# Patient Record
Sex: Female | Born: 1940 | Race: White | Hispanic: No | Marital: Married | Smoking: Never smoker
Health system: Southern US, Community
[De-identification: ages and names within clinical notes are randomized; demographics above are authoritative.]

## PROBLEM LIST (undated history)

## (undated) DIAGNOSIS — M2140 Flat foot [pes planus] (acquired), unspecified foot: Secondary | ICD-10-CM

## (undated) DIAGNOSIS — M84376A Stress fracture, unspecified foot, initial encounter for fracture: Secondary | ICD-10-CM

## (undated) DIAGNOSIS — T849XXA Unspecified complication of internal orthopedic prosthetic device, implant and graft, initial encounter: Secondary | ICD-10-CM

## (undated) DIAGNOSIS — M549 Dorsalgia, unspecified: Secondary | ICD-10-CM

## (undated) DIAGNOSIS — Z8659 Personal history of other mental and behavioral disorders: Secondary | ICD-10-CM

## (undated) DIAGNOSIS — M65319 Trigger thumb, unspecified thumb: Secondary | ICD-10-CM

## (undated) DIAGNOSIS — E785 Hyperlipidemia, unspecified: Secondary | ICD-10-CM

## (undated) DIAGNOSIS — I6523 Occlusion and stenosis of bilateral carotid arteries: Secondary | ICD-10-CM

## (undated) DIAGNOSIS — M19079 Primary osteoarthritis, unspecified ankle and foot: Secondary | ICD-10-CM

## (undated) DIAGNOSIS — M419 Scoliosis, unspecified: Secondary | ICD-10-CM

## (undated) DIAGNOSIS — M542 Cervicalgia: Secondary | ICD-10-CM

## (undated) DIAGNOSIS — G5600 Carpal tunnel syndrome, unspecified upper limb: Secondary | ICD-10-CM

## (undated) DIAGNOSIS — Z96651 Presence of right artificial knee joint: Secondary | ICD-10-CM

## (undated) DIAGNOSIS — S92309A Fracture of unspecified metatarsal bone(s), unspecified foot, initial encounter for closed fracture: Secondary | ICD-10-CM

## (undated) DIAGNOSIS — E079 Disorder of thyroid, unspecified: Secondary | ICD-10-CM

## (undated) DIAGNOSIS — E559 Vitamin D deficiency, unspecified: Secondary | ICD-10-CM

## (undated) DIAGNOSIS — J302 Other seasonal allergic rhinitis: Secondary | ICD-10-CM

## (undated) DIAGNOSIS — K219 Gastro-esophageal reflux disease without esophagitis: Secondary | ICD-10-CM

## (undated) DIAGNOSIS — E871 Hypo-osmolality and hyponatremia: Secondary | ICD-10-CM

## (undated) DIAGNOSIS — N289 Disorder of kidney and ureter, unspecified: Secondary | ICD-10-CM

## (undated) DIAGNOSIS — D509 Iron deficiency anemia, unspecified: Secondary | ICD-10-CM

## (undated) DIAGNOSIS — T8489XA Other specified complication of internal orthopedic prosthetic devices, implants and grafts, initial encounter: Secondary | ICD-10-CM

## (undated) DIAGNOSIS — E539 Vitamin B deficiency, unspecified: Secondary | ICD-10-CM

## (undated) DIAGNOSIS — Z8673 Personal history of transient ischemic attack (TIA), and cerebral infarction without residual deficits: Secondary | ICD-10-CM

## (undated) DIAGNOSIS — J309 Allergic rhinitis, unspecified: Secondary | ICD-10-CM

## (undated) DIAGNOSIS — I6990 Unspecified sequelae of unspecified cerebrovascular disease: Secondary | ICD-10-CM

## (undated) DIAGNOSIS — M8580 Other specified disorders of bone density and structure, unspecified site: Secondary | ICD-10-CM

## (undated) DIAGNOSIS — M6240 Contracture of muscle, unspecified site: Secondary | ICD-10-CM

## (undated) DIAGNOSIS — M1710 Unilateral primary osteoarthritis, unspecified knee: Secondary | ICD-10-CM

## (undated) DIAGNOSIS — I10 Essential (primary) hypertension: Secondary | ICD-10-CM

## (undated) DIAGNOSIS — I509 Heart failure, unspecified: Secondary | ICD-10-CM

## (undated) DIAGNOSIS — G5760 Lesion of plantar nerve, unspecified lower limb: Secondary | ICD-10-CM

## (undated) DIAGNOSIS — M7540 Impingement syndrome of unspecified shoulder: Secondary | ICD-10-CM

## (undated) DIAGNOSIS — M75122 Complete rotator cuff tear or rupture of left shoulder, not specified as traumatic: Secondary | ICD-10-CM

## (undated) DIAGNOSIS — M722 Plantar fascial fibromatosis: Secondary | ICD-10-CM

## (undated) DIAGNOSIS — E039 Hypothyroidism, unspecified: Secondary | ICD-10-CM

## (undated) DIAGNOSIS — S82839A Other fracture of upper and lower end of unspecified fibula, initial encounter for closed fracture: Secondary | ICD-10-CM

## (undated) DIAGNOSIS — S8000XA Contusion of unspecified knee, initial encounter: Secondary | ICD-10-CM

## (undated) DIAGNOSIS — M19049 Primary osteoarthritis, unspecified hand: Secondary | ICD-10-CM

## (undated) DIAGNOSIS — I6340 Cerebral infarction due to embolism of unspecified cerebral artery: Secondary | ICD-10-CM

## (undated) DIAGNOSIS — S39012A Strain of muscle, fascia and tendon of lower back, initial encounter: Secondary | ICD-10-CM

## (undated) DIAGNOSIS — E7800 Pure hypercholesterolemia, unspecified: Secondary | ICD-10-CM

## (undated) DIAGNOSIS — F411 Generalized anxiety disorder: Secondary | ICD-10-CM

## (undated) DIAGNOSIS — R51 Headache: Secondary | ICD-10-CM

## (undated) DIAGNOSIS — M4800 Spinal stenosis, site unspecified: Secondary | ICD-10-CM

## (undated) DIAGNOSIS — J310 Chronic rhinitis: Secondary | ICD-10-CM

## (undated) DIAGNOSIS — F419 Anxiety disorder, unspecified: Secondary | ICD-10-CM

## (undated) DIAGNOSIS — R600 Localized edema: Secondary | ICD-10-CM

## (undated) DIAGNOSIS — I6529 Occlusion and stenosis of unspecified carotid artery: Secondary | ICD-10-CM

## (undated) DIAGNOSIS — Z524 Kidney donor: Secondary | ICD-10-CM

## (undated) DIAGNOSIS — G5762 Lesion of plantar nerve, left lower limb: Secondary | ICD-10-CM

## (undated) DIAGNOSIS — Z85828 Personal history of other malignant neoplasm of skin: Secondary | ICD-10-CM

## (undated) DIAGNOSIS — G4700 Insomnia, unspecified: Secondary | ICD-10-CM

## (undated) DIAGNOSIS — M66879 Spontaneous rupture of other tendons, unspecified ankle and foot: Secondary | ICD-10-CM

## (undated) DIAGNOSIS — G43909 Migraine, unspecified, not intractable, without status migrainosus: Secondary | ICD-10-CM

## (undated) DIAGNOSIS — H02409 Unspecified ptosis of unspecified eyelid: Secondary | ICD-10-CM

## (undated) DIAGNOSIS — M76829 Posterior tibial tendinitis, unspecified leg: Secondary | ICD-10-CM

## (undated) HISTORY — DX: Primary osteoarthritis, unspecified hand: M19.049

## (undated) HISTORY — DX: Unspecified ptosis of unspecified eyelid: H02.409

## (undated) HISTORY — DX: Personal history of other mental and behavioral disorders: Z86.59

## (undated) HISTORY — DX: Personal history of other malignant neoplasm of skin: Z85.828

## (undated) HISTORY — DX: Personal history of transient ischemic attack (TIA), and cerebral infarction without residual deficits: Z86.73

## (undated) HISTORY — DX: Occlusion and stenosis of unspecified carotid artery: I65.29

## (undated) HISTORY — DX: Other fracture of upper and lower end of unspecified fibula, initial encounter for closed fracture: S82.839A

## (undated) HISTORY — DX: Other specified disorders of bone density and structure, unspecified site: M85.80

## (undated) HISTORY — DX: Hyperlipidemia, unspecified: E78.5

## (undated) HISTORY — DX: Fracture of unspecified metatarsal bone(s), unspecified foot, initial encounter for closed fracture: S92.309A

## (undated) HISTORY — DX: Stress fracture, unspecified foot, initial encounter for fracture: M84.376A

## (undated) HISTORY — DX: Iron deficiency anemia, unspecified: D50.9

## (undated) HISTORY — DX: Trigger thumb, unspecified thumb: M65.319

## (undated) HISTORY — DX: Occlusion and stenosis of bilateral carotid arteries: I65.23

## (undated) HISTORY — DX: Hypo-osmolality and hyponatremia: E87.1

## (undated) HISTORY — DX: Lesion of plantar nerve, left lower limb: G57.62

## (undated) HISTORY — DX: Other specified complication of internal orthopedic prosthetic devices, implants and grafts, initial encounter: T84.89XA

## (undated) HISTORY — DX: Complete rotator cuff tear or rupture of left shoulder, not specified as traumatic: M75.122

## (undated) HISTORY — DX: Vitamin D deficiency, unspecified: E55.9

## (undated) HISTORY — DX: Posterior tibial tendinitis, unspecified leg: M76.829

## (undated) HISTORY — DX: Generalized anxiety disorder: F41.1

## (undated) HISTORY — DX: Lesion of plantar nerve, unspecified lower limb: G57.60

## (undated) HISTORY — DX: Impingement syndrome of unspecified shoulder: M75.40

## (undated) HISTORY — DX: Carpal tunnel syndrome, unspecified upper limb: G56.00

## (undated) HISTORY — DX: Unspecified sequelae of unspecified cerebrovascular disease: I69.90

## (undated) HISTORY — DX: Presence of right artificial knee joint: Z96.651

## (undated) HISTORY — DX: Strain of muscle, fascia and tendon of lower back, initial encounter: S39.012A

## (undated) HISTORY — DX: Spinal stenosis, site unspecified: M48.00

## (undated) HISTORY — DX: Kidney donor: Z52.4

## (undated) HISTORY — DX: Headache: R51

## (undated) HISTORY — DX: Vitamin B deficiency, unspecified: E53.9

## (undated) HISTORY — DX: Hypothyroidism, unspecified: E03.9

## (undated) HISTORY — DX: Contusion of unspecified knee, initial encounter: S80.00XA

## (undated) HISTORY — DX: Insomnia, unspecified: G47.00

## (undated) HISTORY — DX: Gastro-esophageal reflux disease without esophagitis: K21.9

## (undated) HISTORY — DX: Allergic rhinitis, unspecified: J30.9

## (undated) HISTORY — DX: Unilateral primary osteoarthritis, unspecified knee: M17.10

## (undated) HISTORY — PX: OTHER SURGICAL HISTORY: SHX169

## (undated) HISTORY — DX: Chronic rhinitis: J31.0

## (undated) HISTORY — DX: Spontaneous rupture of other tendons, unspecified ankle and foot: M66.879

## (undated) HISTORY — DX: Primary osteoarthritis, unspecified ankle and foot: M19.079

## (undated) HISTORY — DX: Plantar fascial fibromatosis: M72.2

## (undated) HISTORY — DX: Scoliosis, unspecified: M41.9

## (undated) HISTORY — DX: Flat foot (pes planus) (acquired), unspecified foot: M21.40

## (undated) HISTORY — DX: Essential (primary) hypertension: I10

## (undated) HISTORY — DX: Unspecified complication of internal orthopedic prosthetic device, implant and graft, initial encounter: T84.9XXA

## (undated) HISTORY — DX: Cerebral infarction due to embolism of unspecified cerebral artery: I63.40

## (undated) HISTORY — DX: Contracture of muscle, unspecified site: M62.40

## (undated) HISTORY — DX: Localized edema: R60.0

---
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
## 1960-10-16 HISTORY — PX: DERMOID CYST  EXCISION: SHX1452

## 1962-12-15 HISTORY — PX: DILATION AND CURETTAGE OF UTERUS: SHX78

## 1971-03-16 HISTORY — PX: ABDOMINAL HYSTERECTOMY: SHX81

## 1979-07-17 HISTORY — PX: OOPHORECTOMY: SHX86

## 1984-09-15 HISTORY — PX: NASAL SINUS SURGERY: SHX719

## 1997-02-13 HISTORY — PX: KIDNEY DONATION: SHX685

## 2003-09-16 HISTORY — PX: KNEE SURGERY: SHX244

## 2004-11-17 ENCOUNTER — Emergency Department (HOSPITAL_COMMUNITY): Admission: EM | Admit: 2004-11-17 | Discharge: 2004-11-17 | Payer: Self-pay | Admitting: Family Medicine

## 2005-07-16 HISTORY — PX: OTHER SURGICAL HISTORY: SHX169

## 2006-09-15 HISTORY — PX: CAROTID ARTERY ANGIOPLASTY: SHX1300

## 2009-02-13 HISTORY — PX: CATARACT EXTRACTION, BILATERAL: SHX1313

## 2009-11-13 HISTORY — PX: REPLACEMENT TOTAL KNEE: SUR1224

## 2010-10-28 DIAGNOSIS — I6990 Unspecified sequelae of unspecified cerebrovascular disease: Secondary | ICD-10-CM

## 2010-10-28 HISTORY — DX: Unspecified sequelae of unspecified cerebrovascular disease: I69.90

## 2010-11-13 DIAGNOSIS — M4802 Spinal stenosis, cervical region: Secondary | ICD-10-CM | POA: Insufficient documentation

## 2011-01-02 DIAGNOSIS — G5600 Carpal tunnel syndrome, unspecified upper limb: Secondary | ICD-10-CM

## 2011-01-02 HISTORY — DX: Carpal tunnel syndrome, unspecified upper limb: G56.00

## 2011-01-14 HISTORY — PX: CARPAL TUNNEL RELEASE: SHX101

## 2011-01-15 DIAGNOSIS — D509 Iron deficiency anemia, unspecified: Secondary | ICD-10-CM

## 2011-01-15 DIAGNOSIS — E539 Vitamin B deficiency, unspecified: Secondary | ICD-10-CM

## 2011-01-15 HISTORY — DX: Iron deficiency anemia, unspecified: D50.9

## 2011-01-15 HISTORY — DX: Vitamin B deficiency, unspecified: E53.9

## 2011-04-30 DIAGNOSIS — E871 Hypo-osmolality and hyponatremia: Secondary | ICD-10-CM | POA: Insufficient documentation

## 2011-04-30 HISTORY — DX: Hypo-osmolality and hyponatremia: E87.1

## 2011-06-16 HISTORY — PX: PTOSIS REPAIR: SHX6568

## 2011-06-17 DIAGNOSIS — H02409 Unspecified ptosis of unspecified eyelid: Secondary | ICD-10-CM

## 2011-06-17 HISTORY — DX: Unspecified ptosis of unspecified eyelid: H02.409

## 2011-11-10 DIAGNOSIS — E559 Vitamin D deficiency, unspecified: Secondary | ICD-10-CM | POA: Insufficient documentation

## 2011-11-10 HISTORY — DX: Vitamin D deficiency, unspecified: E55.9

## 2011-12-30 DIAGNOSIS — M12279 Villonodular synovitis (pigmented), unspecified ankle and foot: Secondary | ICD-10-CM | POA: Insufficient documentation

## 2011-12-30 DIAGNOSIS — S92309A Fracture of unspecified metatarsal bone(s), unspecified foot, initial encounter for closed fracture: Secondary | ICD-10-CM

## 2011-12-30 HISTORY — DX: Fracture of unspecified metatarsal bone(s), unspecified foot, initial encounter for closed fracture: S92.309A

## 2012-01-01 DIAGNOSIS — S8000XA Contusion of unspecified knee, initial encounter: Secondary | ICD-10-CM | POA: Insufficient documentation

## 2012-01-01 HISTORY — DX: Contusion of unspecified knee, initial encounter: S80.00XA

## 2012-04-27 DIAGNOSIS — I6529 Occlusion and stenosis of unspecified carotid artery: Secondary | ICD-10-CM | POA: Insufficient documentation

## 2012-04-27 HISTORY — DX: Occlusion and stenosis of unspecified carotid artery: I65.29

## 2012-06-09 DIAGNOSIS — M2140 Flat foot [pes planus] (acquired), unspecified foot: Secondary | ICD-10-CM

## 2012-06-09 HISTORY — DX: Flat foot (pes planus) (acquired), unspecified foot: M21.40

## 2012-06-15 HISTORY — PX: PTOSIS REPAIR: SHX6568

## 2012-07-16 HISTORY — PX: LUMBAR FUSION: SHX111

## 2012-08-09 DIAGNOSIS — M419 Scoliosis, unspecified: Secondary | ICD-10-CM | POA: Insufficient documentation

## 2012-08-09 HISTORY — DX: Scoliosis, unspecified: M41.9

## 2012-10-11 DIAGNOSIS — R519 Headache, unspecified: Secondary | ICD-10-CM

## 2012-10-11 DIAGNOSIS — R51 Headache: Secondary | ICD-10-CM

## 2012-10-11 HISTORY — DX: Headache, unspecified: R51.9

## 2012-11-03 DIAGNOSIS — K219 Gastro-esophageal reflux disease without esophagitis: Secondary | ICD-10-CM

## 2012-11-03 DIAGNOSIS — F411 Generalized anxiety disorder: Secondary | ICD-10-CM

## 2012-11-03 HISTORY — DX: Generalized anxiety disorder: F41.1

## 2012-11-03 HISTORY — DX: Gastro-esophageal reflux disease without esophagitis: K21.9

## 2012-12-01 DIAGNOSIS — M1710 Unilateral primary osteoarthritis, unspecified knee: Secondary | ICD-10-CM

## 2012-12-01 HISTORY — DX: Unilateral primary osteoarthritis, unspecified knee: M17.10

## 2013-02-14 DIAGNOSIS — J310 Chronic rhinitis: Secondary | ICD-10-CM | POA: Insufficient documentation

## 2013-02-14 DIAGNOSIS — M84376A Stress fracture, unspecified foot, initial encounter for fracture: Secondary | ICD-10-CM

## 2013-02-14 HISTORY — DX: Chronic rhinitis: J31.0

## 2013-02-14 HISTORY — DX: Stress fracture, unspecified foot, initial encounter for fracture: M84.376A

## 2013-03-15 DIAGNOSIS — C4492 Squamous cell carcinoma of skin, unspecified: Secondary | ICD-10-CM

## 2013-03-15 HISTORY — DX: Squamous cell carcinoma of skin, unspecified: C44.92

## 2013-03-15 HISTORY — PX: SQUAMOUS CELL CARCINOMA EXCISION: SHX2433

## 2013-04-05 DIAGNOSIS — Z85828 Personal history of other malignant neoplasm of skin: Secondary | ICD-10-CM

## 2013-04-05 HISTORY — DX: Personal history of other malignant neoplasm of skin: Z85.828

## 2013-06-06 DIAGNOSIS — E039 Hypothyroidism, unspecified: Secondary | ICD-10-CM | POA: Insufficient documentation

## 2013-06-06 HISTORY — DX: Hypothyroidism, unspecified: E03.9

## 2013-06-08 DIAGNOSIS — M8580 Other specified disorders of bone density and structure, unspecified site: Secondary | ICD-10-CM

## 2013-06-08 DIAGNOSIS — I7090 Unspecified atherosclerosis: Secondary | ICD-10-CM | POA: Insufficient documentation

## 2013-06-08 HISTORY — DX: Other specified disorders of bone density and structure, unspecified site: M85.80

## 2013-10-28 DIAGNOSIS — S39012A Strain of muscle, fascia and tendon of lower back, initial encounter: Secondary | ICD-10-CM

## 2013-10-28 HISTORY — DX: Strain of muscle, fascia and tendon of lower back, initial encounter: S39.012A

## 2013-11-13 HISTORY — PX: FOOT FRACTURE SURGERY: SHX645

## 2013-11-14 DIAGNOSIS — M76829 Posterior tibial tendinitis, unspecified leg: Secondary | ICD-10-CM

## 2013-11-14 DIAGNOSIS — M19079 Primary osteoarthritis, unspecified ankle and foot: Secondary | ICD-10-CM | POA: Insufficient documentation

## 2013-11-14 HISTORY — DX: Primary osteoarthritis, unspecified ankle and foot: M19.079

## 2013-11-14 HISTORY — DX: Posterior tibial tendinitis, unspecified leg: M76.829

## 2013-11-17 DIAGNOSIS — R760 Raised antibody titer: Secondary | ICD-10-CM | POA: Insufficient documentation

## 2013-11-17 DIAGNOSIS — Z905 Acquired absence of kidney: Secondary | ICD-10-CM | POA: Insufficient documentation

## 2013-11-17 DIAGNOSIS — Z8673 Personal history of transient ischemic attack (TIA), and cerebral infarction without residual deficits: Secondary | ICD-10-CM

## 2013-11-17 DIAGNOSIS — D500 Iron deficiency anemia secondary to blood loss (chronic): Secondary | ICD-10-CM | POA: Insufficient documentation

## 2013-11-17 DIAGNOSIS — I6340 Cerebral infarction due to embolism of unspecified cerebral artery: Secondary | ICD-10-CM

## 2013-11-17 HISTORY — DX: Personal history of transient ischemic attack (TIA), and cerebral infarction without residual deficits: Z86.73

## 2013-11-17 HISTORY — DX: Cerebral infarction due to embolism of unspecified cerebral artery: I63.40

## 2013-11-24 DIAGNOSIS — T8489XA Other specified complication of internal orthopedic prosthetic devices, implants and grafts, initial encounter: Secondary | ICD-10-CM

## 2013-11-24 DIAGNOSIS — M66879 Spontaneous rupture of other tendons, unspecified ankle and foot: Secondary | ICD-10-CM | POA: Insufficient documentation

## 2013-11-24 DIAGNOSIS — T849XXA Unspecified complication of internal orthopedic prosthetic device, implant and graft, initial encounter: Secondary | ICD-10-CM

## 2013-11-24 DIAGNOSIS — M6240 Contracture of muscle, unspecified site: Secondary | ICD-10-CM

## 2013-11-24 HISTORY — DX: Unspecified complication of internal orthopedic prosthetic device, implant and graft, initial encounter: T84.9XXA

## 2013-11-24 HISTORY — DX: Contracture of muscle, unspecified site: M62.40

## 2013-11-24 HISTORY — DX: Other specified complication of internal orthopedic prosthetic devices, implants and grafts, initial encounter: T84.89XA

## 2013-11-24 HISTORY — DX: Spontaneous rupture of other tendons, unspecified ankle and foot: M66.879

## 2014-06-13 DIAGNOSIS — M7540 Impingement syndrome of unspecified shoulder: Secondary | ICD-10-CM

## 2014-06-13 HISTORY — DX: Impingement syndrome of unspecified shoulder: M75.40

## 2014-10-18 DIAGNOSIS — M179 Osteoarthritis of knee, unspecified: Secondary | ICD-10-CM

## 2014-10-18 DIAGNOSIS — M1710 Unilateral primary osteoarthritis, unspecified knee: Secondary | ICD-10-CM

## 2014-10-18 HISTORY — DX: Osteoarthritis of knee, unspecified: M17.9

## 2014-10-18 HISTORY — DX: Unilateral primary osteoarthritis, unspecified knee: M17.10

## 2014-12-07 DIAGNOSIS — M65319 Trigger thumb, unspecified thumb: Secondary | ICD-10-CM | POA: Insufficient documentation

## 2014-12-07 HISTORY — DX: Trigger thumb, unspecified thumb: M65.319

## 2015-01-18 DIAGNOSIS — M19049 Primary osteoarthritis, unspecified hand: Secondary | ICD-10-CM | POA: Insufficient documentation

## 2015-01-18 HISTORY — DX: Primary osteoarthritis, unspecified hand: M19.049

## 2015-02-08 DIAGNOSIS — M75122 Complete rotator cuff tear or rupture of left shoulder, not specified as traumatic: Secondary | ICD-10-CM

## 2015-02-08 HISTORY — DX: Complete rotator cuff tear or rupture of left shoulder, not specified as traumatic: M75.122

## 2015-03-16 HISTORY — PX: ROTATOR CUFF REPAIR: SHX139

## 2015-05-07 DIAGNOSIS — G5762 Lesion of plantar nerve, left lower limb: Secondary | ICD-10-CM | POA: Insufficient documentation

## 2015-05-07 DIAGNOSIS — G5760 Lesion of plantar nerve, unspecified lower limb: Secondary | ICD-10-CM

## 2015-05-07 HISTORY — DX: Lesion of plantar nerve, left lower limb: G57.62

## 2015-05-07 HISTORY — DX: Lesion of plantar nerve, unspecified lower limb: G57.60

## 2015-07-11 DIAGNOSIS — Z96651 Presence of right artificial knee joint: Secondary | ICD-10-CM | POA: Insufficient documentation

## 2015-07-11 HISTORY — DX: Presence of right artificial knee joint: Z96.651

## 2015-10-25 DIAGNOSIS — M87021 Idiopathic aseptic necrosis of right humerus: Secondary | ICD-10-CM | POA: Insufficient documentation

## 2015-11-05 DIAGNOSIS — Z8673 Personal history of transient ischemic attack (TIA), and cerebral infarction without residual deficits: Secondary | ICD-10-CM

## 2015-11-05 DIAGNOSIS — I10 Essential (primary) hypertension: Secondary | ICD-10-CM | POA: Insufficient documentation

## 2015-11-05 HISTORY — DX: Essential (primary) hypertension: I10

## 2015-11-05 HISTORY — DX: Personal history of transient ischemic attack (TIA), and cerebral infarction without residual deficits: Z86.73

## 2015-11-14 HISTORY — PX: TOTAL SHOULDER REPLACEMENT: SUR1217

## 2015-11-20 DIAGNOSIS — I6523 Occlusion and stenosis of bilateral carotid arteries: Secondary | ICD-10-CM | POA: Insufficient documentation

## 2015-11-20 HISTORY — DX: Occlusion and stenosis of bilateral carotid arteries: I65.23

## 2016-04-16 ENCOUNTER — Emergency Department: Payer: Medicare Other

## 2016-04-16 ENCOUNTER — Emergency Department
Admission: EM | Admit: 2016-04-16 | Discharge: 2016-04-16 | Disposition: A | Discharge status: Home / Self Care | Payer: Medicare Other | Attending: Emergency Medicine | Admitting: Emergency Medicine

## 2016-04-16 DIAGNOSIS — Y939 Activity, unspecified: Secondary | ICD-10-CM | POA: Insufficient documentation

## 2016-04-16 DIAGNOSIS — X58XXXA Exposure to other specified factors, initial encounter: Secondary | ICD-10-CM | POA: Diagnosis not present

## 2016-04-16 DIAGNOSIS — Y999 Unspecified external cause status: Secondary | ICD-10-CM | POA: Diagnosis not present

## 2016-04-16 DIAGNOSIS — S40011A Contusion of right shoulder, initial encounter: Secondary | ICD-10-CM | POA: Diagnosis not present

## 2016-04-16 DIAGNOSIS — Y929 Unspecified place or not applicable: Secondary | ICD-10-CM | POA: Diagnosis not present

## 2016-04-16 DIAGNOSIS — M25511 Pain in right shoulder: Secondary | ICD-10-CM | POA: Diagnosis present

## 2016-04-16 NOTE — ED Triage Notes (Addendum)
Patient ambulatory to triage with steady gait, without difficulty or distress noted; pt reports right shoulder replacement in March; PTA was getting ice out of freezer, slipped, injuring right shoulder; 1 vicodin taken PTA

## 2016-04-16 NOTE — Discharge Instructions (Signed)
Follow-up with your orthopedic doctor if continued pain in 4-48 hours. Wear sling as needed for comfort

## 2016-04-16 NOTE — ED Provider Notes (Signed)
[facility] Emergency Department Provider Note  ____________________________________________  Time seen: Approximately 9:32 PM  I have reviewed the triage vital signs and the nursing notes.   HISTORY  Chief Complaint Shoulder Injury    HPI Audrey Fletcher is a 75 y.o. female patient reports having a right shoulder replacement in March of this year and reinjuring and shoulder tonight. Complains of pain in the shoulder as a 10 over 10.   No past medical history on file.  There are no active problems to display for this patient.   No past surgical history on file.  Prior to Admission medications   Not on File    Allergies Review of patient's allergies indicates not on file.  No family history on file.  Social History Social History  Substance Use Topics  . Smoking status: Not on file  . Smokeless tobacco: Not on file  . Alcohol use Not on file    Review of Systems Constitutional: No fever/chills Musculoskeletal: Right shoulder pain. Skin: Negative for rash. Neurological: Negative for headaches, focal weakness or numbness.  10-point ROS otherwise negative.  ____________________________________________   PHYSICAL EXAM:  VITAL SIGNS: ED Triage Vitals  Enc Vitals Group     BP 04/16/16 2057 (!) 146/72     Pulse Rate 04/16/16 2057 71     Resp 04/16/16 2057 18     Temp 04/16/16 2057 98.3 F (36.8 C)     Temp Source 04/16/16 2057 Oral     SpO2 04/16/16 2057 95 %     Weight 04/16/16 2057 132 lb (59.9 kg)     Height 04/16/16 2057 5\' 3"  (1.6 m)     Head Circumference --      Peak Flow --      Pain Score 04/16/16 2056 10     Pain Loc --      Pain Edu? --      Excl. in [place]? --     Constitutional: Alert and oriented. Well appearing and in no acute distress. Neck: Supple, full range of motion nontender Musculoskeletal: Right shoulder with limited range of motion. Point tenderness noted to the humeral head region. No ecchymosis  bruising distally neurovascularly intact. Unable to extend abduct or abduct the shoulder. Neurologic:  Normal speech and language. No gross focal neurologic deficits are appreciated. No gait instability. Skin:  Skin is warm, dry and intact. No rash noted. Psychiatric: Mood and affect are normal. Speech and behavior are normal.  ____________________________________________   LABS (all labs ordered are listed, but only abnormal results are displayed)  Labs Reviewed - No data to display ____________________________________________  EKG   ____________________________________________  RADIOLOGY   ____________________________________________   PROCEDURES  Procedure(s) performed: None  Critical Care performed: No  ____________________________________________   INITIAL IMPRESSION / ASSESSMENT AND PLAN / ED COURSE  Pertinent labs & imaging results that were available during my care of the patient were reviewed by me and considered in my medical decision making (see chart for details).  Right shoulder contusion. Reassurance provided to the patient encouraged to follow up with her PCP orthopedic surgeon as needed. No acute fracture or dislocation of hardware noted.  Clinical Course    ____________________________________________   FINAL CLINICAL IMPRESSION(S) / ED DIAGNOSES  Final diagnoses:  Shoulder contusion, right, initial encounter     This chart was dictated using voice recognition software/Dragon. Despite best efforts to proofread, errors can occur which can change the meaning. Any change was purely unintentional.    Pierce Crane  Beers, PA-C 04/16/16 2147    Nance Pear, MD 04/16/16 2155

## 2016-05-05 DIAGNOSIS — M722 Plantar fascial fibromatosis: Secondary | ICD-10-CM

## 2016-05-05 HISTORY — DX: Plantar fascial fibromatosis: M72.2

## 2016-09-10 ENCOUNTER — Encounter: Payer: Self-pay | Admitting: Emergency Medicine

## 2016-09-10 ENCOUNTER — Emergency Department
Admission: EM | Admit: 2016-09-10 | Discharge: 2016-09-10 | Disposition: A | Discharge status: Home / Self Care | Payer: Medicare Other | Attending: Emergency Medicine | Admitting: Emergency Medicine

## 2016-09-10 ENCOUNTER — Emergency Department: Payer: Medicare Other

## 2016-09-10 DIAGNOSIS — W1809XA Striking against other object with subsequent fall, initial encounter: Secondary | ICD-10-CM | POA: Insufficient documentation

## 2016-09-10 DIAGNOSIS — Y999 Unspecified external cause status: Secondary | ICD-10-CM | POA: Insufficient documentation

## 2016-09-10 DIAGNOSIS — Y92002 Bathroom of unspecified non-institutional (private) residence single-family (private) house as the place of occurrence of the external cause: Secondary | ICD-10-CM | POA: Diagnosis not present

## 2016-09-10 DIAGNOSIS — I509 Heart failure, unspecified: Secondary | ICD-10-CM | POA: Insufficient documentation

## 2016-09-10 DIAGNOSIS — S8264XA Nondisplaced fracture of lateral malleolus of right fibula, initial encounter for closed fracture: Secondary | ICD-10-CM | POA: Diagnosis not present

## 2016-09-10 DIAGNOSIS — S8991XA Unspecified injury of right lower leg, initial encounter: Secondary | ICD-10-CM | POA: Diagnosis present

## 2016-09-10 DIAGNOSIS — Y9389 Activity, other specified: Secondary | ICD-10-CM | POA: Insufficient documentation

## 2016-09-10 DIAGNOSIS — W19XXXA Unspecified fall, initial encounter: Secondary | ICD-10-CM

## 2016-09-10 HISTORY — DX: Dorsalgia, unspecified: M54.9

## 2016-09-10 HISTORY — DX: Disorder of thyroid, unspecified: E07.9

## 2016-09-10 HISTORY — DX: Other seasonal allergic rhinitis: J30.2

## 2016-09-10 HISTORY — DX: Heart failure, unspecified: I50.9

## 2016-09-10 HISTORY — DX: Disorder of kidney and ureter, unspecified: N28.9

## 2016-09-10 HISTORY — DX: Anxiety disorder, unspecified: F41.9

## 2016-09-10 HISTORY — DX: Migraine, unspecified, not intractable, without status migrainosus: G43.909

## 2016-09-10 HISTORY — DX: Pure hypercholesterolemia, unspecified: E78.00

## 2016-09-10 HISTORY — DX: Cervicalgia: M54.2

## 2016-09-10 LAB — URINALYSIS, COMPLETE (UACMP) WITH MICROSCOPIC
Bilirubin Urine: NEGATIVE
Glucose, UA: NEGATIVE mg/dL
Ketones, ur: NEGATIVE mg/dL
Nitrite: NEGATIVE
Protein, ur: NEGATIVE mg/dL
Specific Gravity, Urine: 1.004 — ABNORMAL LOW (ref 1.005–1.030)
pH: 7 (ref 5.0–8.0)

## 2016-09-10 MED ORDER — HYDROCODONE-ACETAMINOPHEN 5-300 MG PO TABS: 1.0000 | ORAL_TABLET | Freq: Four times a day (QID) | ORAL | 0 refills | Status: AC | PRN

## 2016-09-10 MED ORDER — HYDROCODONE-ACETAMINOPHEN 5-325 MG PO TABS: 1.0000 | ORAL_TABLET | Freq: Once | ORAL | Status: DC

## 2016-09-10 MED ORDER — HYDROCODONE-ACETAMINOPHEN 5-325 MG PO TABS
1.0000 | ORAL_TABLET | Freq: Once | ORAL | Status: AC
  Administered 2016-09-10: 1 via ORAL
  Filled 2016-09-10: qty 1

## 2016-09-10 NOTE — ED Triage Notes (Signed)
Pt to ed with c/o 2 falls today.  Pt states first fall caused her to hit her head on the door knob.  Reports she was standing at sink and left knee became weak and she fell.  Reports second fall caused her to hit her back on the nightstand and now has pain in head, back, right knee, right ankle and right leg pain. Denies loss of consciousness.

## 2016-09-10 NOTE — ED Provider Notes (Signed)
[facility] Emergency Department Provider Note ____________________________________________  Time seen: Approximately 3:45 PM  I have reviewed the triage vital signs and the nursing notes.   HISTORY  Chief Complaint Fall    HPI Audrey Fletcher is a 75 y.o. female presenting to the emergency department after 2 falls. The first fall occurred last night while patient was standing at the bathroom sink. Patient states that her right leg "gave out". Patient has a history of osteoarthritis of the right knee. Patient states that during initial fall, her head struck the bathroom door knob. From initial fall, patient complains of right ankle pain, right upper thigh pain and occipital soreness. Ankle pain is currently 8 out of 10 in intensity and described as aching. Occipital pain is 3 out of 10 in intensity. She denies loss of consciousness, nausea, vomiting and blurry vision. Patient fell again today while trying to use her walker. Patient did not hit her head during this second fall. She is complaining of right sided lumbar pain. Denies dysuria, hematuria or increased urinary frequency. 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 She resides in a townhouse without steps with her husband.  Past Medical History:  Diagnosis Date  . Anxiety   . Back pain   . CHF (congestive heart failure) ([place])   . High cholesterol   . Migraines   . Neck pain   . Renal disorder   . Seasonal allergies   . Thyroid disease     There are no active problems to display for this patient.   Past Surgical History:  Procedure Laterality Date  . thyroidectomy      Prior to Admission medications   Not on File    Allergies Allegra [fexofenadine]; Codeine; Oxycodone; Tetracyclines & related; and Sulfa antibiotics  History reviewed. No pertinent family history.  Social History Social History  Substance Use Topics  . Smoking status: Never Smoker  . Smokeless tobacco: Never  Used  . Alcohol use No    Review of Systems Constitutional: No recent illness. Cardiovascular: Denies chest pain or palpitations. Respiratory: Denies shortness of breath. Musculoskeletal: Pain along lateral malleolus, left flank and right upper thigh. Skin: Negative for rash, wound, lesion. Neurological: Negative for focal weakness or numbness.  ____________________________________________   PHYSICAL EXAM:  VITAL SIGNS: ED Triage Vitals  Enc Vitals Group     BP 09/10/16 1505 (!) 131/54     Pulse Rate 09/10/16 1505 86     Resp 09/10/16 1505 18     Temp 09/10/16 1505 98.1 F (36.7 C)     Temp Source 09/10/16 1505 Oral     SpO2 09/10/16 1505 97 %     Weight 09/10/16 1506 132 lb (59.9 kg)     Height 09/10/16 1506 5\' 3"  (1.6 m)     Head Circumference --      Peak Flow --      Pain Score 09/10/16 1506 2     Pain Loc --      Pain Edu? --      Excl. in [place]? --     Constitutional: Alert and oriented. Well appearing and in no acute distress. Eyes: Conjunctivae are normal. EOMI. Head: Atraumatic. Neck: No stridor.  Respiratory: Normal respiratory effort.   Musculoskeletal: Patient has 5/5 strength in the upper and lower extremities bilaterally. Full range of motion at the shoulder, elbow and wrist bilaterally. Full range of motion at the hip, knee and ankle bilaterally. She has tenderness elicited with  right hip flexor testing. She has tenderness to light palpation along lateral malleolus, right. Patient is able to demonstrate plantar flexion and dorsi flexion, right. Patient has no tenderness along lumbar spine. Pain is not intensified with flexion or extension at the spine. Palpable dorsalis pedis pulse, right. Reflexes are 2+ and symmetric. Neurologic:  Normal speech and language. No gross focal neurologic deficits are appreciated. Speech is normal. No gait instability. Skin:  Skin is warm, dry and intact. Atraumatic. Psychiatric: Mood and affect are normal. Speech and behavior  are normal.  ____________________________________________   LABS (all labs ordered are listed, but only abnormal results are displayed)  Labs Reviewed  URINALYSIS, COMPLETE (UACMP) WITH MICROSCOPIC   ____________________________________________  RADIOLOGY  Unk Pinto, personally viewed and evaluated these images (plain radiographs) as part of my medical decision making, as well as reviewing the written report by the radiologist.  Lumbar:  IMPRESSION:  1. Posterior fusion from L2-L5 with normal alignment.  2. No acute compression deformity.  3. Question of mild subsidence of the interbody fusion plug into the  superior aspect of L5.   DG Hip Unilat with Pelvis  IMPRESSION:  1. No acute fracture.  2. Probable old fracture of the left pelvic ramus.  3. Osteopenia.   DG Ankle Complete Right  Subtle nondisplaced oblique fracture of the distal right fibula.  PROCEDURES  Procedure(s) performed:  Vicodin  Right lower extremity splint    ____________________________________________   INITIAL IMPRESSION / ASSESSMENT AND PLAN / ED COURSE  Clinical Course     Pertinent labs & imaging results that were available during my care of the patient were reviewed by me and considered in my medical decision making (see chart for details).  Assessment and plan: Patient presented to the emergency department after 2 falls. She does not have a history of falls prior to last night. No loss of consciousness, blurry vision, nausea or vomiting. CT head is not warranted at this time. DG right ankle complete reveals subtle lateral malleolar fracture. Patient underwent right lower extremity splinting the emergency department tonight. She was advised to follow up with orthopedics tomorrow. A referral was made to the orthopedist on-call, Dr. Mack Guise. Patient was discharged with a 2 day course of Vicodin. Vicodin was selected as patient has tolerated it well in the past. All patient questions  were answered. Vital signs are stable at this time.  ____________________________________________   FINAL CLINICAL IMPRESSION(S) / ED DIAGNOSES  Final diagnoses:  Falls       Lannie Fields, PA-C 09/10/16 1817    Hinda Kehr, MD 09/11/16 0000

## 2016-09-10 NOTE — ED Notes (Signed)
Pt taken to xray 

## 2016-09-10 NOTE — ED Notes (Signed)
Pt states fell last night, hit back of head on door knob. States fell again this morning in bathroom, states hit middle back and R ankle/foot/leg. Pt is alert and oriented x 4, speaking in complete sentences, sitting in wheelchair.  Pt aware of need for urine, stating she urinated while waiting in lobby.

## 2016-09-11 DIAGNOSIS — S82839A Other fracture of upper and lower end of unspecified fibula, initial encounter for closed fracture: Secondary | ICD-10-CM | POA: Insufficient documentation

## 2016-09-11 HISTORY — DX: Other fracture of upper and lower end of unspecified fibula, initial encounter for closed fracture: S82.839A

## 2017-04-02 DIAGNOSIS — Z524 Kidney donor: Secondary | ICD-10-CM

## 2017-04-02 HISTORY — DX: Kidney donor: Z52.4

## 2017-04-03 DIAGNOSIS — E785 Hyperlipidemia, unspecified: Secondary | ICD-10-CM | POA: Insufficient documentation

## 2017-04-03 DIAGNOSIS — K219 Gastro-esophageal reflux disease without esophagitis: Secondary | ICD-10-CM | POA: Insufficient documentation

## 2017-04-03 DIAGNOSIS — M4800 Spinal stenosis, site unspecified: Secondary | ICD-10-CM

## 2017-04-03 DIAGNOSIS — G4700 Insomnia, unspecified: Secondary | ICD-10-CM

## 2017-04-03 DIAGNOSIS — Z8659 Personal history of other mental and behavioral disorders: Secondary | ICD-10-CM | POA: Insufficient documentation

## 2017-04-03 HISTORY — DX: Personal history of other mental and behavioral disorders: Z86.59

## 2017-04-03 HISTORY — DX: Gastro-esophageal reflux disease without esophagitis: K21.9

## 2017-04-03 HISTORY — DX: Insomnia, unspecified: G47.00

## 2017-04-03 HISTORY — DX: Spinal stenosis, site unspecified: M48.00

## 2017-04-03 HISTORY — DX: Hyperlipidemia, unspecified: E78.5

## 2017-04-20 DIAGNOSIS — R600 Localized edema: Secondary | ICD-10-CM | POA: Insufficient documentation

## 2017-04-20 DIAGNOSIS — N183 Chronic kidney disease, stage 3 unspecified: Secondary | ICD-10-CM | POA: Insufficient documentation

## 2017-04-20 HISTORY — DX: Localized edema: R60.0

## 2017-04-20 HISTORY — PX: TOTAL KNEE ARTHROPLASTY: SHX125

## 2017-06-04 ENCOUNTER — Encounter: Payer: Self-pay | Admitting: *Deleted

## 2017-06-04 ENCOUNTER — Emergency Department: Payer: No Typology Code available for payment source

## 2017-06-04 ENCOUNTER — Emergency Department
Admission: EM | Admit: 2017-06-04 | Discharge: 2017-06-04 | Disposition: A | Discharge status: Home / Self Care | Payer: No Typology Code available for payment source | Attending: Student in an Organized Health Care Education/Training Program | Admitting: Student in an Organized Health Care Education/Training Program

## 2017-06-04 DIAGNOSIS — M25569 Pain in unspecified knee: Secondary | ICD-10-CM

## 2017-06-04 DIAGNOSIS — M25562 Pain in left knee: Secondary | ICD-10-CM | POA: Insufficient documentation

## 2017-06-04 DIAGNOSIS — I509 Heart failure, unspecified: Secondary | ICD-10-CM | POA: Insufficient documentation

## 2017-06-04 DIAGNOSIS — R0789 Other chest pain: Secondary | ICD-10-CM | POA: Insufficient documentation

## 2017-06-04 MED ORDER — TRAMADOL HCL 50 MG PO TABS
50.0000 mg | ORAL_TABLET | Freq: Once | ORAL | Status: AC
  Administered 2017-06-04: 50 mg via ORAL
  Filled 2017-06-04: qty 1

## 2017-06-04 MED ORDER — ACETAMINOPHEN 325 MG PO TABS
650.0000 mg | ORAL_TABLET | Freq: Once | ORAL | Status: AC
  Administered 2017-06-04: 650 mg via ORAL
  Filled 2017-06-04: qty 2

## 2017-06-04 MED ORDER — TRAMADOL HCL 50 MG PO TABS: 50.0000 mg | ORAL_TABLET | Freq: Four times a day (QID) | ORAL | 0 refills | Status: DC | PRN
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
## 2017-06-04 NOTE — ED Notes (Signed)
MD at bedside with RN, patient and patient's husband
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
## 2017-06-04 NOTE — ED Notes (Signed)
Patient transported to X-ray 

## 2017-06-04 NOTE — ED Notes (Signed)
MD at bedside. 
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
## 2017-06-24 ENCOUNTER — Other Ambulatory Visit: Payer: Self-pay

## 2017-06-24 ENCOUNTER — Ambulatory Visit (INDEPENDENT_AMBULATORY_CARE_PROVIDER_SITE_OTHER): Payer: Medicare Other | Admitting: Cardiothoracic Surgery

## 2017-06-24 VITALS — BP 111/63 | HR 58 | Temp 98.1°F | Wt 136.0 lb

## 2017-06-24 DIAGNOSIS — S2220XA Unspecified fracture of sternum, initial encounter for closed fracture: Secondary | ICD-10-CM | POA: Diagnosis not present

## 2017-06-24 DIAGNOSIS — J309 Allergic rhinitis, unspecified: Secondary | ICD-10-CM | POA: Insufficient documentation

## 2017-06-24 DIAGNOSIS — I10 Essential (primary) hypertension: Secondary | ICD-10-CM

## 2017-06-24 DIAGNOSIS — S2220XB Unspecified fracture of sternum, initial encounter for open fracture: Secondary | ICD-10-CM | POA: Diagnosis not present

## 2017-06-24 HISTORY — DX: Essential (primary) hypertension: I10

## 2017-06-24 HISTORY — DX: Allergic rhinitis, unspecified: J30.9
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
## 2017-06-24 NOTE — Patient Instructions (Addendum)
We will see you soon to give you the CT Scan results.

## 2017-07-01 ENCOUNTER — Ambulatory Visit
Admission: RE | Admit: 2017-07-01 | Discharge: 2017-07-01 | Disposition: A | Discharge status: Home / Self Care | Payer: Medicare Other | Source: Ambulatory Visit | Attending: Cardiothoracic Surgery | Admitting: Cardiothoracic Surgery

## 2017-07-01 DIAGNOSIS — S2222XA Fracture of body of sternum, initial encounter for closed fracture: Secondary | ICD-10-CM | POA: Insufficient documentation

## 2017-07-01 DIAGNOSIS — X58XXXA Exposure to other specified factors, initial encounter: Secondary | ICD-10-CM | POA: Insufficient documentation

## 2017-07-01 DIAGNOSIS — S2220XB Unspecified fracture of sternum, initial encounter for open fracture: Secondary | ICD-10-CM

## 2017-07-01 DIAGNOSIS — I700 Atherosclerosis of aorta: Secondary | ICD-10-CM | POA: Insufficient documentation

## 2017-07-01 DIAGNOSIS — K449 Diaphragmatic hernia without obstruction or gangrene: Secondary | ICD-10-CM | POA: Insufficient documentation

## 2017-07-01 LAB — POCT I-STAT CREATININE: Creatinine, Ser: 1.1 mg/dL — ABNORMAL HIGH (ref 0.44–1.00)

## 2017-07-01 MED ORDER — IOPAMIDOL (ISOVUE-300) INJECTION 61%
75.0000 mL | Freq: Once | INTRAVENOUS | Status: AC | PRN
  Administered 2017-07-01: 75 mL via INTRAVENOUS

## 2017-07-03 ENCOUNTER — Ambulatory Visit (INDEPENDENT_AMBULATORY_CARE_PROVIDER_SITE_OTHER): Payer: Medicare Other | Admitting: Cardiothoracic Surgery

## 2017-07-03 ENCOUNTER — Encounter: Payer: Self-pay | Admitting: Cardiothoracic Surgery

## 2017-07-03 ENCOUNTER — Ambulatory Visit: Payer: Self-pay | Admitting: Cardiothoracic Surgery

## 2017-07-03 VITALS — BP 120/67 | HR 64 | Temp 97.7°F | Ht 63.0 in | Wt 141.6 lb

## 2017-07-03 DIAGNOSIS — S2220XD Unspecified fracture of sternum, subsequent encounter for fracture with routine healing: Secondary | ICD-10-CM | POA: Diagnosis not present
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
## 2017-07-03 NOTE — Progress Notes (Signed)
  Patient ID: Audrey Fletcher, female   DOB: 23-Jul-1941, 76 y.o.   MRN: [id number]  HISTORY: He returns today in follow-up. She continues to complain of chest pain.  She describes significant pain with coughing and sneezing.  He also feels a burning sensation or a tearing sensation with movements. She did have a chest CT scan done. She thinks that the pain is actually improving over the course of time She is unable to take nonsteroidals secondary to her poor renal function.  Vitals:   07/03/17 1009  BP: 120/67  Pulse: 64  Temp: 97.7 F (36.5 C)     EXAM:    Resp: Lungs are clear bilaterally.  No respiratory distress, normal effort. Heart:  Regular without murmurs Abd:  Abdomen is soft, non distended and non tender. No masses are palpable.  There is no rebound and no guarding.  Neurological: Alert and oriented to person, place, and time. Coordination normal.  Skin: Skin is warm and dry. No rash noted. No diaphoretic. No erythema. No pallor.  Psychiatric: Normal mood and affect. Normal behavior. Judgment and thought content normal.  Here is some tenderness to palpation along the anterior chest wall. There is no bruising evident.   ASSESSMENT: I have independently reviewed her chest CT. There is a fracture of the midsternum involving he proximal portion of the body.There is no surrounding hematoma or other defect  PLAN:   I explained to the patient that her sternal fracture is something that only time and medications would help.I did not recommend any surgical intervention.I would like to see her back again in one month    Nestor Lewandowsky, MD

## 2017-07-06 ENCOUNTER — Encounter: Payer: Self-pay | Admitting: Cardiothoracic Surgery

## 2017-10-09 ENCOUNTER — Ambulatory Visit
Admission: RE | Admit: 2017-10-09 | Discharge: 2017-10-09 | Disposition: A | Discharge status: Home / Self Care | Payer: Medicare Other | Source: Ambulatory Visit | Attending: Cardiothoracic Surgery | Admitting: Cardiothoracic Surgery

## 2017-10-09 ENCOUNTER — Encounter: Payer: Self-pay | Admitting: Cardiothoracic Surgery

## 2017-10-09 ENCOUNTER — Ambulatory Visit (INDEPENDENT_AMBULATORY_CARE_PROVIDER_SITE_OTHER): Payer: Medicare Other | Admitting: Cardiothoracic Surgery

## 2017-10-09 ENCOUNTER — Other Ambulatory Visit: Payer: Self-pay

## 2017-10-09 VITALS — BP 138/77 | HR 65 | Temp 98.0°F | Resp 18 | Ht 63.0 in | Wt 142.2 lb

## 2017-10-09 DIAGNOSIS — S2220XB Unspecified fracture of sternum, initial encounter for open fracture: Secondary | ICD-10-CM | POA: Insufficient documentation

## 2017-10-09 DIAGNOSIS — S2220XD Unspecified fracture of sternum, subsequent encounter for fracture with routine healing: Secondary | ICD-10-CM | POA: Diagnosis not present

## 2017-10-09 DIAGNOSIS — X58XXXA Exposure to other specified factors, initial encounter: Secondary | ICD-10-CM | POA: Insufficient documentation

## 2017-10-09 NOTE — Patient Instructions (Signed)
Please call our office if you have questions or concerns.   

## 2017-10-09 NOTE — Progress Notes (Signed)
  Patient ID: Audrey Fletcher, female   DOB: 12-Mar-1941, 77 y.o.   MRN: [id number]  HISTORY: She returns today in follow-up.  She is now about 4 or 5 months out from her sternal fracture.  She states that the pain is much improved.  It only hurts whenever she coughs.  She has no other complaints today.  She is not short of breath.   Vitals:   10/09/17 0854  BP: 138/77  Pulse: 65  Resp: 18  Temp: 98 F (36.7 C)  SpO2: 95%     EXAM:    Resp: Lungs are clear bilaterally.  No respiratory distress, normal effort. Heart:  Regular without murmurs Abd:  Abdomen is soft, non distended and non tender. No masses are palpable.  There is no rebound and no guarding.  Neurological: Alert and oriented to person, place, and time. Coordination normal.  Skin: Skin is warm and dry. No rash noted. No diaphoretic. No erythema. No pallor.  Psychiatric: Normal mood and affect. Normal behavior. Judgment and thought content normal.  There may be a slight palpable deformity to the sternum just below the angle of [place].  There is no soft tissue component.  There is no erythema.   ASSESSMENT: I have independently reviewed the chest x-ray she had made today.  This shows a continued sternal fracture with some callus formation.   PLAN:   At the present time I did not see any need for any additional follow-up as she is essentially asymptomatic.  If she has any further problems I be happy to see her in follow-up.    Nestor Lewandowsky, MD

## 2017-11-05 ENCOUNTER — Telehealth: Payer: Self-pay | Admitting: Cardiothoracic Surgery

## 2017-11-05 NOTE — Telephone Encounter (Signed)
Patient stated that she is having pain on the right side of chest radiating up to her  shoulder blade. She states that the pain medication is helping but that she does not want to take them every time she is having pain.  I advised patient that we suggest that she follow up with her primary care doctor and if  They advise we can refer her to pain management. Patient verbalized understanding at this time.

## 2017-11-05 NOTE — Telephone Encounter (Signed)
Patients calling and would like on of the nurses to give her a call back, patient just has some questions or is wondering if she may need to be referred somewhere. Please call patient and advise.

## 2017-11-24 ENCOUNTER — Emergency Department: Payer: No Typology Code available for payment source

## 2017-11-24 ENCOUNTER — Inpatient Hospital Stay
Admission: EM | Admit: 2017-11-24 | Discharge: 2017-11-26 | DRG: 551 | Disposition: A | Discharge status: Home Health | Payer: No Typology Code available for payment source | Attending: Specialist | Admitting: Specialist

## 2017-11-24 DIAGNOSIS — G43909 Migraine, unspecified, not intractable, without status migrainosus: Secondary | ICD-10-CM | POA: Diagnosis present

## 2017-11-24 DIAGNOSIS — E7800 Pure hypercholesterolemia, unspecified: Secondary | ICD-10-CM | POA: Diagnosis present

## 2017-11-24 DIAGNOSIS — Z8249 Family history of ischemic heart disease and other diseases of the circulatory system: Secondary | ICD-10-CM

## 2017-11-24 DIAGNOSIS — Z8673 Personal history of transient ischemic attack (TIA), and cerebral infarction without residual deficits: Secondary | ICD-10-CM

## 2017-11-24 DIAGNOSIS — Z882 Allergy status to sulfonamides status: Secondary | ICD-10-CM

## 2017-11-24 DIAGNOSIS — S2221XA Fracture of manubrium, initial encounter for closed fracture: Secondary | ICD-10-CM | POA: Diagnosis present

## 2017-11-24 DIAGNOSIS — M549 Dorsalgia, unspecified: Secondary | ICD-10-CM

## 2017-11-24 DIAGNOSIS — Z820 Family history of epilepsy and other diseases of the nervous system: Secondary | ICD-10-CM

## 2017-11-24 DIAGNOSIS — S32010A Wedge compression fracture of first lumbar vertebra, initial encounter for closed fracture: Secondary | ICD-10-CM | POA: Diagnosis present

## 2017-11-24 DIAGNOSIS — Z96611 Presence of right artificial shoulder joint: Secondary | ICD-10-CM | POA: Diagnosis present

## 2017-11-24 DIAGNOSIS — Z8261 Family history of arthritis: Secondary | ICD-10-CM

## 2017-11-24 DIAGNOSIS — Z79899 Other long term (current) drug therapy: Secondary | ICD-10-CM

## 2017-11-24 DIAGNOSIS — Z981 Arthrodesis status: Secondary | ICD-10-CM | POA: Diagnosis not present

## 2017-11-24 DIAGNOSIS — I5032 Chronic diastolic (congestive) heart failure: Secondary | ICD-10-CM | POA: Diagnosis present

## 2017-11-24 DIAGNOSIS — J9601 Acute respiratory failure with hypoxia: Secondary | ICD-10-CM | POA: Diagnosis present

## 2017-11-24 DIAGNOSIS — D509 Iron deficiency anemia, unspecified: Secondary | ICD-10-CM | POA: Diagnosis present

## 2017-11-24 DIAGNOSIS — R52 Pain, unspecified: Secondary | ICD-10-CM | POA: Diagnosis present

## 2017-11-24 DIAGNOSIS — Z7982 Long term (current) use of aspirin: Secondary | ICD-10-CM

## 2017-11-24 DIAGNOSIS — F411 Generalized anxiety disorder: Secondary | ICD-10-CM | POA: Diagnosis present

## 2017-11-24 DIAGNOSIS — M19049 Primary osteoarthritis, unspecified hand: Secondary | ICD-10-CM | POA: Diagnosis present

## 2017-11-24 DIAGNOSIS — R202 Paresthesia of skin: Secondary | ICD-10-CM | POA: Diagnosis present

## 2017-11-24 DIAGNOSIS — Z79890 Hormone replacement therapy (postmenopausal): Secondary | ICD-10-CM

## 2017-11-24 DIAGNOSIS — F329 Major depressive disorder, single episode, unspecified: Secondary | ICD-10-CM | POA: Diagnosis present

## 2017-11-24 DIAGNOSIS — Z888 Allergy status to other drugs, medicaments and biological substances status: Secondary | ICD-10-CM | POA: Diagnosis not present

## 2017-11-24 DIAGNOSIS — N319 Neuromuscular dysfunction of bladder, unspecified: Secondary | ICD-10-CM | POA: Diagnosis present

## 2017-11-24 DIAGNOSIS — Z90711 Acquired absence of uterus with remaining cervical stump: Secondary | ICD-10-CM

## 2017-11-24 DIAGNOSIS — M8580 Other specified disorders of bone density and structure, unspecified site: Secondary | ICD-10-CM | POA: Diagnosis present

## 2017-11-24 DIAGNOSIS — Z9862 Peripheral vascular angioplasty status: Secondary | ICD-10-CM

## 2017-11-24 DIAGNOSIS — K219 Gastro-esophageal reflux disease without esophagitis: Secondary | ICD-10-CM | POA: Diagnosis present

## 2017-11-24 DIAGNOSIS — Z9841 Cataract extraction status, right eye: Secondary | ICD-10-CM | POA: Diagnosis not present

## 2017-11-24 DIAGNOSIS — R0789 Other chest pain: Secondary | ICD-10-CM

## 2017-11-24 DIAGNOSIS — Z85828 Personal history of other malignant neoplasm of skin: Secondary | ICD-10-CM

## 2017-11-24 DIAGNOSIS — S22029A Unspecified fracture of second thoracic vertebra, initial encounter for closed fracture: Secondary | ICD-10-CM | POA: Diagnosis present

## 2017-11-24 DIAGNOSIS — Z885 Allergy status to narcotic agent status: Secondary | ICD-10-CM | POA: Diagnosis not present

## 2017-11-24 DIAGNOSIS — S2243XA Multiple fractures of ribs, bilateral, initial encounter for closed fracture: Secondary | ICD-10-CM | POA: Diagnosis present

## 2017-11-24 DIAGNOSIS — Z96653 Presence of artificial knee joint, bilateral: Secondary | ICD-10-CM | POA: Diagnosis present

## 2017-11-24 DIAGNOSIS — Z823 Family history of stroke: Secondary | ICD-10-CM

## 2017-11-24 DIAGNOSIS — J9811 Atelectasis: Secondary | ICD-10-CM | POA: Diagnosis present

## 2017-11-24 DIAGNOSIS — M419 Scoliosis, unspecified: Secondary | ICD-10-CM | POA: Diagnosis present

## 2017-11-24 DIAGNOSIS — Z9842 Cataract extraction status, left eye: Secondary | ICD-10-CM

## 2017-11-24 DIAGNOSIS — I110 Hypertensive heart disease with heart failure: Secondary | ICD-10-CM | POA: Diagnosis present

## 2017-11-24 DIAGNOSIS — M19079 Primary osteoarthritis, unspecified ankle and foot: Secondary | ICD-10-CM | POA: Diagnosis present

## 2017-11-24 DIAGNOSIS — Z8042 Family history of malignant neoplasm of prostate: Secondary | ICD-10-CM

## 2017-11-24 DIAGNOSIS — Y92410 Unspecified street and highway as the place of occurrence of the external cause: Secondary | ICD-10-CM

## 2017-11-24 DIAGNOSIS — E039 Hypothyroidism, unspecified: Secondary | ICD-10-CM | POA: Diagnosis present

## 2017-11-24 DIAGNOSIS — Z7983 Long term (current) use of bisphosphonates: Secondary | ICD-10-CM

## 2017-11-24 DIAGNOSIS — G4700 Insomnia, unspecified: Secondary | ICD-10-CM | POA: Diagnosis present

## 2017-11-24 DIAGNOSIS — Z8262 Family history of osteoporosis: Secondary | ICD-10-CM

## 2017-11-24 DIAGNOSIS — Z79891 Long term (current) use of opiate analgesic: Secondary | ICD-10-CM

## 2017-11-24 DIAGNOSIS — Z841 Family history of disorders of kidney and ureter: Secondary | ICD-10-CM

## 2017-11-24 DIAGNOSIS — E785 Hyperlipidemia, unspecified: Secondary | ICD-10-CM | POA: Diagnosis present

## 2017-11-24 LAB — CBC WITH DIFFERENTIAL/PLATELET
BASOS PCT: 0 %
Basophils Absolute: 0 10*3/uL (ref 0–0.1)
Eosinophils Absolute: 0.2 10*3/uL (ref 0–0.7)
Eosinophils Relative: 2 %
HCT: 42 % (ref 35.0–47.0)
HEMOGLOBIN: 13.6 g/dL (ref 12.0–16.0)
LYMPHS ABS: 1.2 10*3/uL (ref 1.0–3.6)
Lymphocytes Relative: 10 %
MCH: 29.8 pg (ref 26.0–34.0)
MCHC: 32.4 g/dL (ref 32.0–36.0)
MCV: 92 fL (ref 80.0–100.0)
MONO ABS: 0.5 10*3/uL (ref 0.2–0.9)
MONOS PCT: 4 %
Neutro Abs: 10.3 10*3/uL — ABNORMAL HIGH (ref 1.4–6.5)
Neutrophils Relative %: 84 %
Platelets: 250 10*3/uL (ref 150–440)
RBC: 4.57 MIL/uL (ref 3.80–5.20)
RDW: 14 % (ref 11.5–14.5)
WBC: 12.1 10*3/uL — ABNORMAL HIGH (ref 3.6–11.0)

## 2017-11-24 LAB — BASIC METABOLIC PANEL
ANION GAP: 11 (ref 5–15)
BUN: 21 mg/dL — ABNORMAL HIGH (ref 6–20)
CALCIUM: 8.5 mg/dL — AB (ref 8.9–10.3)
CHLORIDE: 102 mmol/L (ref 101–111)
CO2: 26 mmol/L (ref 22–32)
Creatinine, Ser: 1.19 mg/dL — ABNORMAL HIGH (ref 0.44–1.00)
GFR calc Af Amer: 50 mL/min — ABNORMAL LOW (ref >60)
GFR calc non Af Amer: 43 mL/min — ABNORMAL LOW (ref >60)
GLUCOSE: 108 mg/dL — AB (ref 65–99)
Potassium: 3.9 mmol/L (ref 3.5–5.1)
Sodium: 139 mmol/L (ref 135–145)

## 2017-11-24 MED ORDER — ONDANSETRON HCL 4 MG/2ML IJ SOLN
INTRAMUSCULAR | Status: AC
  Administered 2017-11-24: 23:00:00
  Filled 2017-11-24: qty 4

## 2017-11-24 MED ORDER — ONDANSETRON 4 MG PO TBDP
8.0000 mg | ORAL_TABLET | Freq: Once | ORAL | Status: DC
  Filled 2017-11-24: qty 2

## 2017-11-24 MED ORDER — MORPHINE SULFATE (PF) 4 MG/ML IV SOLN
4.0000 mg | Freq: Once | INTRAVENOUS | Status: AC
  Administered 2017-11-24: 4 mg via INTRAMUSCULAR
  Filled 2017-11-24: qty 1

## 2017-11-24 MED ORDER — IOPAMIDOL (ISOVUE-300) INJECTION 61%
100.0000 mL | Freq: Once | INTRAVENOUS | Status: AC | PRN
  Administered 2017-11-24: 75 mL via INTRAVENOUS
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
## 2017-11-25 ENCOUNTER — Other Ambulatory Visit: Payer: Self-pay

## 2017-11-25 DIAGNOSIS — R52 Pain, unspecified: Secondary | ICD-10-CM | POA: Diagnosis present

## 2017-11-25 MED ORDER — ESOMEPRAZOLE MAGNESIUM 10 MG PO PACK: 10.0000 mg | PACK | Freq: Every day | ORAL | Status: DC

## 2017-11-25 MED ORDER — POTASSIUM CHLORIDE CRYS ER 10 MEQ PO TBCR
10.0000 meq | EXTENDED_RELEASE_TABLET | Freq: Two times a day (BID) | ORAL | Status: DC
  Administered 2017-11-25 – 2017-11-26 (×3): 10 meq via ORAL
  Filled 2017-11-25 (×3): qty 1

## 2017-11-25 MED ORDER — FUROSEMIDE 40 MG PO TABS
40.0000 mg | ORAL_TABLET | Freq: Every day | ORAL | Status: DC
  Filled 2017-11-25: qty 1

## 2017-11-25 MED ORDER — FLUTICASONE PROPIONATE 50 MCG/ACT NA SUSP
2.0000 | Freq: Two times a day (BID) | NASAL | Status: DC
  Administered 2017-11-25 (×2): 2 via NASAL
  Filled 2017-11-25: qty 16

## 2017-11-25 MED ORDER — LORATADINE 10 MG PO TABS
10.0000 mg | ORAL_TABLET | Freq: Every day | ORAL | Status: DC
  Administered 2017-11-25 – 2017-11-26 (×2): 10 mg via ORAL
  Filled 2017-11-25 (×2): qty 1

## 2017-11-25 MED ORDER — MORPHINE SULFATE (PF) 2 MG/ML IV SOLN
2.0000 mg | INTRAVENOUS | Status: DC | PRN
  Administered 2017-11-25 – 2017-11-26 (×2): 2 mg via INTRAVENOUS
  Filled 2017-11-25 (×2): qty 1

## 2017-11-25 MED ORDER — MECLIZINE HCL 25 MG PO TABS
25.0000 mg | ORAL_TABLET | Freq: Four times a day (QID) | ORAL | Status: DC | PRN
  Filled 2017-11-25: qty 1

## 2017-11-25 MED ORDER — TRAZODONE HCL 100 MG PO TABS
100.0000 mg | ORAL_TABLET | Freq: Every day | ORAL | Status: DC
  Administered 2017-11-25: 100 mg via ORAL
  Filled 2017-11-25: qty 1

## 2017-11-25 MED ORDER — FENTANYL CITRATE (PF) 100 MCG/2ML IJ SOLN
50.0000 ug | Freq: Once | INTRAMUSCULAR | Status: AC
  Administered 2017-11-25: 50 ug via INTRAVENOUS
  Filled 2017-11-25: qty 2

## 2017-11-25 MED ORDER — HYDROCODONE-ACETAMINOPHEN 5-325 MG PO TABS
1.0000 | ORAL_TABLET | Freq: Four times a day (QID) | ORAL | Status: DC | PRN
  Administered 2017-11-25: 1 via ORAL
  Filled 2017-11-25: qty 1

## 2017-11-25 MED ORDER — BUTALBITAL-APAP-CAFFEINE 50-325-40 MG PO TABS
1.0000 | ORAL_TABLET | Freq: Four times a day (QID) | ORAL | Status: DC
  Administered 2017-11-25: 1 via ORAL
  Administered 2017-11-25: 2 via ORAL
  Administered 2017-11-25 (×2): 1 via ORAL
  Filled 2017-11-25 (×4): qty 2

## 2017-11-25 MED ORDER — ASPIRIN EC 81 MG PO TBEC
81.0000 mg | DELAYED_RELEASE_TABLET | Freq: Every day | ORAL | Status: DC
  Administered 2017-11-25: 81 mg via ORAL
  Filled 2017-11-25: qty 1

## 2017-11-25 MED ORDER — SODIUM CHLORIDE 0.9 % IV SOLN
INTRAVENOUS | Status: DC
  Administered 2017-11-25: 06:00:00 via INTRAVENOUS

## 2017-11-25 MED ORDER — CYCLOBENZAPRINE HCL 10 MG PO TABS
10.0000 mg | ORAL_TABLET | Freq: Three times a day (TID) | ORAL | Status: DC
  Administered 2017-11-25 – 2017-11-26 (×5): 10 mg via ORAL
  Filled 2017-11-25 (×5): qty 1

## 2017-11-25 MED ORDER — ALENDRONATE SODIUM 70 MG PO TABS: 70.0000 mg | ORAL_TABLET | ORAL | Status: DC

## 2017-11-25 MED ORDER — MORPHINE SULFATE (PF) 4 MG/ML IV SOLN
4.0000 mg | Freq: Once | INTRAVENOUS | Status: AC
  Administered 2017-11-25: 4 mg via INTRAVENOUS
  Filled 2017-11-25: qty 1

## 2017-11-25 MED ORDER — ACETAMINOPHEN 650 MG RE SUPP: 650.0000 mg | Freq: Four times a day (QID) | RECTAL | Status: DC | PRN

## 2017-11-25 MED ORDER — LEVOTHYROXINE SODIUM 25 MCG PO TABS: 125.0000 ug | ORAL_TABLET | ORAL | Status: DC

## 2017-11-25 MED ORDER — METOPROLOL SUCCINATE ER 25 MG PO TB24
25.0000 mg | ORAL_TABLET | Freq: Every day | ORAL | Status: DC
  Administered 2017-11-25: 25 mg via ORAL
  Filled 2017-11-25: qty 1

## 2017-11-25 MED ORDER — PANTOPRAZOLE SODIUM 40 MG PO PACK
20.0000 mg | PACK | Freq: Every day | ORAL | Status: DC
  Administered 2017-11-25: 20 mg via ORAL
  Filled 2017-11-25 (×2): qty 20

## 2017-11-25 MED ORDER — CALCIUM CITRATE 950 (200 CA) MG PO TABS
200.0000 mg | ORAL_TABLET | Freq: Two times a day (BID) | ORAL | Status: DC
  Administered 2017-11-25 (×2): 200 mg via ORAL
  Filled 2017-11-25 (×4): qty 1

## 2017-11-25 MED ORDER — ONDANSETRON HCL 4 MG PO TABS: 4.0000 mg | ORAL_TABLET | Freq: Four times a day (QID) | ORAL | Status: DC | PRN

## 2017-11-25 MED ORDER — CITALOPRAM HYDROBROMIDE 20 MG PO TABS
40.0000 mg | ORAL_TABLET | Freq: Every day | ORAL | Status: DC
  Administered 2017-11-25 – 2017-11-26 (×2): 40 mg via ORAL
  Filled 2017-11-25 (×2): qty 2

## 2017-11-25 MED ORDER — LEVOTHYROXINE SODIUM 25 MCG PO TABS
125.0000 ug | ORAL_TABLET | ORAL | Status: DC
  Administered 2017-11-26: 08:00:00 125 ug via ORAL

## 2017-11-25 MED ORDER — ACETAMINOPHEN 325 MG PO TABS: 650.0000 mg | ORAL_TABLET | Freq: Four times a day (QID) | ORAL | Status: DC | PRN

## 2017-11-25 MED ORDER — HYDROCODONE-ACETAMINOPHEN 5-325 MG PO TABS
1.0000 | ORAL_TABLET | Freq: Four times a day (QID) | ORAL | Status: DC | PRN
  Administered 2017-11-25 – 2017-11-26 (×5): 2 via ORAL
  Filled 2017-11-25 (×5): qty 2

## 2017-11-25 MED ORDER — LEVOTHYROXINE SODIUM 100 MCG PO TABS
100.0000 ug | ORAL_TABLET | ORAL | Status: DC
  Administered 2017-11-25 – 2017-11-26 (×2): 100 ug via ORAL
  Filled 2017-11-25 (×2): qty 1

## 2017-11-25 MED ORDER — ENOXAPARIN SODIUM 40 MG/0.4ML ~~LOC~~ SOLN
40.0000 mg | SUBCUTANEOUS | Status: DC
  Administered 2017-11-25 – 2017-11-26 (×2): 40 mg via SUBCUTANEOUS
  Filled 2017-11-25 (×2): qty 0.4

## 2017-11-25 MED ORDER — VITAMIN B-12 1000 MCG PO TABS
2000.0000 ug | ORAL_TABLET | Freq: Every day | ORAL | Status: DC
  Administered 2017-11-25: 2000 ug via ORAL
  Filled 2017-11-25: qty 2

## 2017-11-25 MED ORDER — ONDANSETRON HCL 4 MG/2ML IJ SOLN: 4.0000 mg | Freq: Four times a day (QID) | INTRAMUSCULAR | Status: DC | PRN

## 2017-11-25 MED ORDER — DOCUSATE SODIUM 100 MG PO CAPS
100.0000 mg | ORAL_CAPSULE | Freq: Two times a day (BID) | ORAL | Status: DC
  Administered 2017-11-25 – 2017-11-26 (×3): 100 mg via ORAL
  Filled 2017-11-25 (×3): qty 1

## 2017-11-25 MED ORDER — ATORVASTATIN CALCIUM 20 MG PO TABS
20.0000 mg | ORAL_TABLET | Freq: Every day | ORAL | Status: DC
  Administered 2017-11-25: 20 mg via ORAL
  Filled 2017-11-25: qty 1

## 2017-11-25 NOTE — Progress Notes (Signed)
PT Cancellation Note  Patient Details Name: Genoveva Singleton MRN: [id number] DOB: 1941-06-08   Cancelled Treatment:    Reason Eval/Treat Not Completed: Other (comment). Hold PT consult until TLSO brace applied. Will continue to follow   Coden Franchi 11/25/2017, 2:19 PM  Greggory Stallion, PT, DPT [phone]
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
## 2017-11-25 NOTE — NC FL2 (Signed)
[place] LEVEL OF CARE SCREENING TOOL     IDENTIFICATION  Patient Name: Audrey Fletcher Birthdate: 11-Dec-1940 Sex: female Admission Date (Current Location): 11/24/2017  [place] and [place] Number:  Engineering geologist and Address:  [facility], [address] [place], [place] [address]      Provider Number: [id number]  Attending Physician Name and Address:  Henreitta Leber, MD  Relative Name and Phone Number:       Current Level of Care: Hospital Recommended Level of Care: [place] Prior Approval Number:    Date Approved/Denied:   PASRR Number: [id number] )  Discharge Plan: SNF    Current Diagnoses: Patient Active Problem List   Diagnosis Date Noted  . Intractable pain 11/25/2017  . Allergic rhinitis 06/24/2017  . Hypertension 06/24/2017  . Bilateral leg edema 04/20/2017  . Chronic kidney disease (CKD), stage III (moderate) ([place]) 04/20/2017  . History of depression 04/03/2017  . GERD (gastroesophageal reflux disease) 04/03/2017  . HLD (hyperlipidemia) 04/03/2017  . Insomnia 04/03/2017  . Neuroforaminal stenosis of spine 04/03/2017  . 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 Closed fracture of distal fibula 09/11/2016  . Plantar fasciitis, left 05/05/2016  . Bilateral carotid artery stenosis 11/20/2015  . Essential hypertension 11/05/2015  . History of CVA (cerebrovascular accident) 11/05/2015  . Avascular necrosis of humeral head, right ([place]) 10/25/2015  . H/O total knee replacement, right 07/11/2015  . Morton neuroma, left 05/07/2015  . Morton's metatarsalgia 05/07/2015  . Complete tear of left rotator cuff 02/08/2015  . Osteoarthritis of hand 01/18/2015  . Snapping thumb syndrome 12/07/2014  . Osteoarthritis of knee 10/18/2014  . Rotator cuff impingement syndrome 06/13/2014  . Complications due to internal orthopedic device, implant, and graft ([place]) 11/24/2013  . Contracture of tendon sheath  11/24/2013  . Nontraumatic rupture of other tendons of foot and ankle 11/24/2013  . Other complications due to other internal orthopedic device, implant, and graft 11/24/2013  . Acquired absence of kidney 11/17/2013  . Cerebral embolism with cerebral infarction ([place]) 11/17/2013  . History of stroke 11/17/2013  . Iron deficiency anemia due to chronic blood loss 11/17/2013  . Lupus anticoagulant positive 11/17/2013  . Arthritis of foot, degenerative 11/14/2013  . Posterior tibial tendonitis 11/14/2013  . Low back strain 10/28/2013  . Atherosclerosis 06/08/2013  . Hypothyroidism 06/06/2013  . Personal history of malignant neoplasm of skin 04/05/2013  . Chronic rhinitis 02/14/2013  . Fracture, stress, metatarsal 02/14/2013  . Arthritis of knee 12/01/2012  . Anxiety state 11/03/2012  . Esophageal reflux 11/03/2012  . Headache 10/11/2012  . Scoliosis 08/09/2012  . Pes planus 06/09/2012  . Carotid artery occlusion 04/27/2012  . Knee contusion 01/01/2012  . Closed fracture of metatarsal bone 12/30/2011  . Villonodular synovitis of ankle and foot 12/30/2011  . Vitamin D deficiency 11/10/2011  . Ptosis of eyelid 06/17/2011  . Hyposmolality and/or hyponatremia 04/30/2011  . B-complex deficiency 01/15/2011  . Iron deficiency anemia 01/15/2011  . Carpal tunnel syndrome 01/02/2011  . Spinal stenosis in cervical region 11/13/2010  . Late effects of cerebrovascular disease 10/28/2010    Orientation RESPIRATION BLADDER Height & Weight     Self, Time, Situation, Place  O2(2 Liters Oxygen. ) Continent Weight: 137 lb 2 oz (62.2 kg) Height:  5\' 3"  (160 cm)  BEHAVIORAL SYMPTOMS/MOOD NEUROLOGICAL BOWEL NUTRITION STATUS      Continent Diet(Diet: Heart Healthy )  AMBULATORY STATUS COMMUNICATION OF NEEDS Skin   Extensive Assist Verbally Normal  Personal Care Assistance Level of Assistance  Bathing, Feeding, Dressing Bathing Assistance: Limited assistance Feeding  assistance: Independent Dressing Assistance: Limited assistance     Functional Limitations Info  Sight, Hearing, Speech Sight Info: Adequate Hearing Info: Adequate Speech Info: Adequate    SPECIAL CARE FACTORS FREQUENCY  PT (By licensed PT), OT (By licensed OT)     PT Frequency: (5) OT Frequency: (5)            Contractures      Additional Factors Info  Code Status, Allergies Code Status Info: (Full Code. ) Allergies Info: (Nsaids, Tolmetin, Bacitracin, Fexofenadine Hcl, Oxycodone, Sulfa Antibiotics, Baclofen, Codeine, Hydromorphone, Sulfasalazine, Tetracyclines & Related)           Current Medications (11/25/2017):  This is the current hospital active medication list Current Facility-Administered Medications  Medication Dose Route Frequency Provider Last Rate Last Dose  . acetaminophen (TYLENOL) tablet 650 mg  650 mg Oral Q6H PRN Harrie Foreman, MD       Or  . acetaminophen (TYLENOL) suppository 650 mg  650 mg Rectal Q6H PRN Harrie Foreman, MD      . aspirin EC tablet 81 mg  81 mg Oral QHS Harrie Foreman, MD      . atorvastatin (LIPITOR) tablet 20 mg  20 mg Oral QHS Harrie Foreman, MD      . butalbital-acetaminophen-caffeine (FIORICET, ESGIC) [phone] MG per tablet 1-2 tablet  1-2 tablet Oral QID Harrie Foreman, MD   1 tablet at 11/25/17 1300  . calcium citrate (CALCITRATE - dosed in mg elemental calcium) tablet 200 mg of elemental calcium  200 mg of elemental calcium Oral BID Harrie Foreman, MD   200 mg of elemental calcium at 11/25/17 1147  . citalopram (CELEXA) tablet 40 mg  40 mg Oral Daily Harrie Foreman, MD   40 mg at 11/25/17 3244  . cyclobenzaprine (FLEXERIL) tablet 10 mg  10 mg Oral TID Harrie Foreman, MD   10 mg at 11/25/17 0102  . docusate sodium (COLACE) capsule 100 mg  100 mg Oral BID Harrie Foreman, MD   100 mg at 11/25/17 0831  . enoxaparin (LOVENOX) injection 40 mg  40 mg Subcutaneous Q24H Harrie Foreman, MD   40 mg  at 11/25/17 0647  . fluticasone (FLONASE) 50 MCG/ACT nasal spray 2 spray  2 spray Each Nare BID Harrie Foreman, MD   2 spray at 11/25/17 [id number]  . furosemide (LASIX) tablet 40 mg  40 mg Oral Q1200 Harrie Foreman, MD      . HYDROcodone-acetaminophen (NORCO/VICODIN) 5-325 MG per tablet 1-2 tablet  1-2 tablet Oral Q6H PRN Henreitta Leber, MD   2 tablet at 11/25/17 1300  . levothyroxine (SYNTHROID, LEVOTHROID) tablet 100 mcg  100 mcg Oral Once per day on Sun Tue Wed Thu Sat Harrie Foreman, MD   100 mcg at 11/25/17 0831  . [START ON 11/27/2017] levothyroxine (SYNTHROID, LEVOTHROID) tablet 125 mcg  125 mcg Oral Once per day on Mon Fri Maccia, Melissa D, RPH      . loratadine (CLARITIN) tablet 10 mg  10 mg Oral Daily Harrie Foreman, MD   10 mg at 11/25/17 0831  . meclizine (ANTIVERT) tablet 25 mg  25 mg Oral Q6H PRN Harrie Foreman, MD      . metoprolol succinate (TOPROL-XL) 24 hr tablet 25 mg  25 mg Oral QHS Harrie Foreman, MD      . morphine  2 MG/ML injection 2 mg  2 mg Intravenous Q4H PRN Harrie Foreman, MD      . ondansetron [facility]) tablet 4 mg  4 mg Oral Q6H PRN Harrie Foreman, MD       Or  . ondansetron [facility]) injection 4 mg  4 mg Intravenous Q6H PRN Harrie Foreman, MD      . pantoprazole sodium (PROTONIX) 40 mg/20 mL oral suspension 20 mg  20 mg Oral QHS Harrie Foreman, MD      . potassium chloride (K-DUR,KLOR-CON) CR tablet 10 mEq  10 mEq Oral BID Harrie Foreman, MD   10 mEq at 11/25/17 0831  . traZODone (DESYREL) tablet 100 mg  100 mg Oral QHS Harrie Foreman, MD      . vitamin B-12 (CYANOCOBALAMIN) tablet 2,000 mcg  2,000 mcg Oral Q1200 Harrie Foreman, MD   2,000 mcg at 11/25/17 1147     Discharge Medications: Please see discharge summary for a list of discharge medications.  Relevant Imaging Results:  Relevant Lab Results:   Additional Information (SSN: [id number])  Kyann Heydt, Veronia Beets, LCSW

## 2017-11-25 NOTE — H&P (Signed)
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    Review of Systems  Constitutional: Negative for chills and fever.  HENT: Negative for sore throat and tinnitus.   Eyes: Negative for blurred vision and redness.  Respiratory: Negative for cough and shortness of breath.   Cardiovascular: Negative for chest pain, palpitations, orthopnea and PND.  Gastrointestinal: Negative for abdominal pain, diarrhea, nausea and vomiting.  Genitourinary: Negative for dysuria, frequency and urgency.  Musculoskeletal: Positive for back pain. Negative for joint pain and myalgias.  Skin: Negative for rash.       No lesions  Neurological: Negative for speech change, focal weakness and weakness.  Endo/Heme/Allergies: Does not bruise/bleed easily.       No temperature intolerance  Psychiatric/Behavioral: Negative for depression and suicidal ideas.    Blood pressure (!) 139/44, pulse 97, temperature 98.6 F (37 C), temperature source Oral, resp. rate 18, height '5\' 3"'$  (1.6 m), weight 62.2 kg (137 lb 2 oz), SpO2 (!) 86 %. Physical Exam  Vitals reviewed. Constitutional: She is oriented to person, place, and time. She appears well-developed and well-nourished. No distress.  HENT:  Head: Normocephalic  and atraumatic.  Mouth/Throat: Oropharynx is clear and moist.  Eyes: Conjunctivae and EOM are normal. Pupils are equal, round, and reactive to light. No scleral icterus.  Neck: Normal range of motion. Neck supple. No JVD present. No tracheal deviation present. No thyromegaly present.  Cardiovascular: Normal rate, regular rhythm and normal heart sounds.  Exam reveals no gallop and no friction rub.  No murmur heard. Respiratory: Effort normal and breath sounds normal.  GI: Soft. Bowel sounds are normal. She exhibits no distension. There is no tenderness.  Genitourinary:  Genitourinary Comments: Deferred  Musculoskeletal: Normal range of motion. She exhibits no edema.  Lymphadenopathy:    She has no cervical adenopathy.  Neurological: She is alert and oriented to person, place, and time. No cranial nerve deficit. She exhibits normal muscle tone.  Skin: Skin is warm and dry. No rash noted. No erythema.  Psychiatric: She has a normal mood and affect. Her behavior is normal. Judgment and thought content normal.     Assessment/Plan This is a 77 year old female admitted for intractable pain. 1.  Pain: Intractable; IV narcotics as needed for severe pain.  PT/OT for outpatient therapy/mobility/safety.  Insult neurosurgery for vertebral fracture. 2.  Hypertension: Acceptable for age; continue metoprolol 3.  CHF: Diastolic; chronic.  Continue Lasix per home regimen 4.  Hypothyroidism: Continue Synthroid.  Check TSH 5.  Lipidemia: Continue statin therapy 6.  Depression: Continue Celexa 7.  DVT prophylaxis: Lovenox 8.  GI prophylaxis: PPI per home regimen The patient is a full code.  Time spent on admission orders and patient care approximately 45 minutes  Harrie Foreman, MD 11/25/2017, 8:11 AM

## 2017-11-25 NOTE — Progress Notes (Signed)
OT Cancellation Note  Patient Details Name: Audrey Fletcher MRN: [id number] DOB: 07-25-1941   Cancelled Treatment:    Reason Eval/Treat Not Completed: Patient declined, no reason specified. Order received, chart reviewed. Family in room with pt upon entry. Pt politely declining OT this afternoon due to fatigue and pain. Agreeable to OT evaluation tomorrow am.   Jeni Salles, MPH, MS, OTR/L ascom [phone] 11/25/17, 4:13 PM
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
## 2017-11-25 NOTE — ED Notes (Signed)
Pt placed on bedpan to void.
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
## 2017-11-26 ENCOUNTER — Inpatient Hospital Stay: Payer: No Typology Code available for payment source

## 2017-11-26 LAB — BASIC METABOLIC PANEL
Anion gap: 7 (ref 5–15)
BUN: 11 mg/dL (ref 6–20)
CALCIUM: 8.2 mg/dL — AB (ref 8.9–10.3)
CHLORIDE: 101 mmol/L (ref 101–111)
CO2: 30 mmol/L (ref 22–32)
Creatinine, Ser: 0.75 mg/dL (ref 0.44–1.00)
Glucose, Bld: 123 mg/dL — ABNORMAL HIGH (ref 65–99)
Potassium: 3.7 mmol/L (ref 3.5–5.1)
SODIUM: 138 mmol/L (ref 135–145)

## 2017-11-26 MED ORDER — HYDROCODONE-ACETAMINOPHEN 10-325 MG PO TABS: 0.5000 | ORAL_TABLET | Freq: Four times a day (QID) | ORAL | 0 refills | Status: AC | PRN

## 2017-11-26 MED ORDER — IOPAMIDOL (ISOVUE-370) INJECTION 76%
75.0000 mL | Freq: Once | INTRAVENOUS | Status: AC | PRN
  Administered 2017-11-26: 75 mL via INTRAVENOUS

## 2017-11-26 NOTE — Progress Notes (Signed)
Patient is being discharged home. O2 was believed and educated. Reviewed meds, scripts, and last dose taken.  Discussed how important it is for patient to move around at home and using IS at home. Reminded patient about not being mobile can lead to PNA, PE, and increased O2 need.  IVs removed with caths intact.  Script given to daughter, also talked about Tylenol limits and other meds that contain Tylenol. Allowed time for questions.

## 2017-11-26 NOTE — Progress Notes (Signed)
OT Cancellation Note  Patient Details Name: Audrey Fletcher MRN: [id number] DOB: Sep 16, 1940   Cancelled Treatment:    Reason Eval/Treat Not Completed: Patient declined, no reason specified. Pt declining OT evaluation this am, as she was still working on eating breakfast and had family visiting in the room. Will re-attempt at later date/time as pt is available.  Jeni Salles, MPH, MS, OTR/L ascom [phone] 11/26/17, 9:04 AM

## 2017-11-26 NOTE — Progress Notes (Addendum)
SATURATION QUALIFICATIONS: (This note is used to comply with regulatory documentation for home oxygen)  Patient Saturations on Room Air at Rest = 90%  Patient Saturations on Room Air while Ambulating = *80%  Patient Saturations on 2 Liters of oxygen while Ambulating = 92%  Please briefly explain why patient needs home oxygen: Patient desats with activity.

## 2017-11-26 NOTE — Progress Notes (Signed)
Results of CT are available. Text MD.  Notified MD that patient was requiring a dose of Morphine d/t pain 10/10.   Talked with MD and CT is negative for PE and is okay to be discharged home when O2 is delivered.    Informed patient and daughter, and they expressed concerns about going home.  Inquired about getting the external cath put back in place.  Explained to patient and daughter that she will be going home and needs to get up to the bathroom and if she continues not to move that she could end up with a PE or PNA.  Oral pain meds have controlled pain. With activity increases her pain increases. Discussed that the brace may be uncomfortable, but it has a purpose and should be worn while OOB.
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
## 2017-11-26 NOTE — Care Management (Signed)
Patient is still short of breath and will require home oxygen

## 2017-11-26 NOTE — Progress Notes (Signed)
OT Cancellation Note  Patient Details Name: Audrey Fletcher MRN: [id number] DOB: 04/24/1941   Cancelled Treatment:    Reason Eval/Treat Not Completed: Other (comment). On 2nd attempt to see this date, pt was discharging from the hospital, leaving the room in transport chair. Unavailable for OT evaluation. Will complete order.  Jeni Salles, MPH, MS, OTR/L ascom [phone] 11/26/17, 4:08 PM

## 2017-11-26 NOTE — Plan of Care (Signed)
  Education: Knowledge of General Education information will improve 11/26/2017 0503 - Progressing by Mette Southgate, Lucille Passy, RN   Health Behavior/Discharge Planning: Ability to manage health-related needs will improve 11/26/2017 0503 - Progressing by Daivd Fredericksen, Lucille Passy, RN   Clinical Measurements: Ability to maintain clinical measurements within normal limits will improve 11/26/2017 0503 - Progressing by Meela Wareing, Lucille Passy, RN Will remain free from infection 11/26/2017 0503 - Progressing by Violetta Lavalle, Lucille Passy, RN Diagnostic test results will improve 11/26/2017 0503 - Progressing by Kaylea Mounsey, Lucille Passy, RN Respiratory complications will improve 11/26/2017 0503 - Progressing by Bryna Colander, RN Cardiovascular complication will be avoided 11/26/2017 0503 - Progressing by Theophil Thivierge, Lucille Passy, RN

## 2017-11-26 NOTE — Care Management Important Message (Signed)
Important Message  Patient Details  Name: Audrey Fletcher MRN: [id number] Date of Birth: August 10, 1941   Medicare Important Message Given:  Yes    Juliann Pulse A Dinorah Masullo 11/26/2017, 9:54 AM

## 2017-12-01 ENCOUNTER — Telehealth: Payer: Self-pay

## 2017-12-01 NOTE — Telephone Encounter (Signed)
Heard back from [place], who mentioned patient could benefit from [facility] aide services.  She's made Fritzi Mandes with [facility] aware of request to add aide services. I've called patient to make her aware that aide services were being added to assist her with bathing and that she should hear back from [facility] regarding that soon.

## 2017-12-01 NOTE — Telephone Encounter (Signed)
Flagged on EMMI report as not reading discharge papers.  Called and spoke with patient.  She states she does have them and has read them now.     She does have a concern regarding her home health services.  She mentioned when she was told at discharge that a RN from [facility] would come out first to do an assessment.  She has not had a RN come out, but PT has come out to see her.  She's worried about not being able to bathe as she's not able to bend, lift, or twist.  Reports she just had her first bath since discharge recently and that it took both her daughters to help her.  She's requesting if Home Health RN could assist with this or if she may benefit from OT.   I told her I would follow up with Orvan July, [facility] on 1A floor to see what assistance could be offered and that Lattie Haw or myself would follow up with her.

## 2017-12-01 NOTE — Care Management (Signed)
TC to [facility] and requested they add a HHA to home health services

## 2017-12-04 ENCOUNTER — Telehealth: Payer: Self-pay

## 2017-12-04 NOTE — Telephone Encounter (Signed)
EMMI Follow-up: Flagged for other questions on the EMMI report.  I called the patient and her concern was that the [facility] agency hadn't contacted her yet. The [facility] agency had contacted her this morning and was coming out this afternoon. No other needs noted at this time.

## 2019-04-25 DIAGNOSIS — D219 Benign neoplasm of connective and other soft tissue, unspecified: Secondary | ICD-10-CM

## 2019-04-25 HISTORY — DX: Benign neoplasm of connective and other soft tissue, unspecified: D21.9
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
## 2021-12-24 ENCOUNTER — Encounter: Payer: Self-pay | Admitting: Dermatology

## 2022-02-25 ENCOUNTER — Encounter: Payer: Self-pay | Admitting: Dermatology

## 2022-02-25 ENCOUNTER — Ambulatory Visit: Payer: Medicare HMO | Admitting: Dermatology

## 2022-02-25 DIAGNOSIS — L578 Other skin changes due to chronic exposure to nonionizing radiation: Secondary | ICD-10-CM | POA: Diagnosis not present

## 2022-02-25 DIAGNOSIS — L814 Other melanin hyperpigmentation: Secondary | ICD-10-CM

## 2022-02-25 DIAGNOSIS — B351 Tinea unguium: Secondary | ICD-10-CM | POA: Diagnosis not present

## 2022-02-25 DIAGNOSIS — L820 Inflamed seborrheic keratosis: Secondary | ICD-10-CM | POA: Diagnosis not present

## 2022-02-25 DIAGNOSIS — Z7189 Other specified counseling: Secondary | ICD-10-CM

## 2022-02-25 DIAGNOSIS — L720 Epidermal cyst: Secondary | ICD-10-CM

## 2022-02-25 DIAGNOSIS — D1800 Hemangioma unspecified site: Secondary | ICD-10-CM

## 2022-02-25 DIAGNOSIS — L853 Xerosis cutis: Secondary | ICD-10-CM

## 2022-02-25 DIAGNOSIS — L821 Other seborrheic keratosis: Secondary | ICD-10-CM

## 2022-02-25 DIAGNOSIS — Z85828 Personal history of other malignant neoplasm of skin: Secondary | ICD-10-CM

## 2022-02-25 DIAGNOSIS — Z1283 Encounter for screening for malignant neoplasm of skin: Secondary | ICD-10-CM | POA: Diagnosis not present

## 2022-02-25 DIAGNOSIS — D692 Other nonthrombocytopenic purpura: Secondary | ICD-10-CM

## 2022-02-25 MED ORDER — TERBINAFINE HCL 250 MG PO TABS: 250.0000 mg | ORAL_TABLET | Freq: Every day | ORAL | 0 refills | Status: DC

## 2022-02-25 NOTE — Patient Instructions (Addendum)
Cryotherapy Aftercare  Wash gently with soap and water everyday.   Apply Vaseline and Band-Aid daily until healed.    Effaclar - Spot treat white bumps (milia) around eye at night until improved. Avoid getting in eye.    Terbinafine Counseling  Terbinafine is an anti-fungal medicine that can be applied to the skin (over the counter) or taken by mouth (prescription) to treat fungal infections. The pill version is often used to treat fungal infections of the nails or scalp. While most people do not have any side effects from taking terbinafine pills, some possible side effects of the medicine can include taste changes, headache, loss of smell, vision changes, nausea, vomiting, or diarrhea.   Rare side effects can include irritation of the liver, allergic reaction, or decrease in blood counts (which may show up as not feeling well or developing an infection). If you are concerned about any of these side effects, please stop the medicine and call your doctor, or in the case of an emergency such as feeling very unwell, seek immediate medical care.     Gentle Skin Care Guide  1. Bathe no more than once a day.  2. Avoid bathing in hot water  3. Use a mild soap like Dove, Vanicream, Cetaphil, CeraVe. Can use Lever 2000 or Cetaphil antibacterial soap  4. Use soap only where you need it. On most days, use it under your arms, between your legs, and on your feet. Let the water rinse other areas unless visibly dirty.  5. When you get out of the bath/shower, use a towel to gently blot your skin dry, don't rub it.  6. While your skin is still a little damp, apply a moisturizing cream such as Vanicream, CeraVe, Cetaphil, Eucerin, Sarna lotion or plain Vaseline Jelly. For hands apply Neutrogena [place] Hand Cream or Excipial Hand Cream.  7. Reapply moisturizer any time you start to itch or feel dry.  8. Sometimes using free and clear laundry detergents can be helpful. Fabric softener sheets should  be avoided. Downy Free & Gentle liquid, or any liquid fabric softener that is free of dyes and perfumes, it acceptable to use  9. If your doctor has given you prescription creams you may apply moisturizers over them    Seborrheic Keratosis  What causes seborrheic keratoses? Seborrheic keratoses are harmless, common skin growths that first appear during adult life.  As time goes by, more growths appear.  Some people may develop a large number of them.  Seborrheic keratoses appear on both covered and uncovered body parts.  They are not caused by sunlight.  The tendency to develop seborrheic keratoses can be inherited.  They vary in color from skin-colored to gray, brown, or even black.  They can be either smooth or have a rough, warty surface.   Seborrheic keratoses are superficial and look as if they were stuck on the skin.  Under the microscope this type of keratosis looks like layers upon layers of skin.  That is why at times the top layer may seem to fall off, but the rest of the growth remains and re-grows.    Treatment Seborrheic keratoses do not need to be treated, but can easily be removed in the office.  Seborrheic keratoses often cause symptoms when they rub on clothing or jewelry.  Lesions can be in the way of shaving.  If they become inflamed, they can cause itching, soreness, or burning.  Removal of a seborrheic keratosis can be accomplished by freezing, burning, or  surgery. If any spot bleeds, scabs, or grows rapidly, please return to have it checked, as these can be an indication of a skin cancer.   Due to recent changes in healthcare laws, you may see results of your pathology and/or laboratory studies on MyChart before the doctors have had a chance to review them. We understand that in some cases there may be results that are confusing or concerning to you. Please understand that not all results are received at the same time and often the doctors may need to interpret multiple results  in order to provide you with the best plan of care or course of treatment. Therefore, we ask that you please give [place] 2 business days to thoroughly review all your results before contacting the office for clarification. Should we see a critical lab result, you will be contacted sooner.   If You Need Anything After Your Visit  If you have any questions or concerns for your doctor, please call our main line at [phone] and press option 4 to reach your doctor's medical assistant. If no one answers, please leave a voicemail as directed and we will return your call as soon as possible. Messages left after 4 pm will be answered the following business day.   You may also send [place] a message via [place]. We typically respond to MyChart messages within 1-2 business days.  For prescription refills, please ask your pharmacy to contact our office. Our fax number is [phone].  If you have an urgent issue when the clinic is closed that cannot wait until the next business day, you can page your doctor at the number below.    Please note that while we do our best to be available for urgent issues outside of office hours, we are not available 24/7.   If you have an urgent issue and are unable to reach [place], you may choose to seek medical care at your doctor's office, retail clinic, urgent care center, or emergency room.  If you have a medical emergency, please immediately call 911 or go to the emergency department.  Pager Numbers  - Dr. Nehemiah Massed: [phone]  - Dr. Laurence Ferrari: [phone]  - Dr. Nicole Kindred: [phone]  In the event of inclement weather, please call our main line at [phone] for an update on the status of any delays or closures.  Dermatology Medication Tips: Please keep the boxes that topical medications come in in order to help keep track of the instructions about where and how to use these. Pharmacies typically print the medication instructions only on the boxes and not directly on  the medication tubes.   If your medication is too expensive, please contact our office at [phone] option 4 or send [place] a message through [place].   We are unable to tell what your co-pay for medications will be in advance as this is different depending on your insurance coverage. However, we may be able to find a substitute medication at lower cost or fill out paperwork to get insurance to cover a needed medication.   If a prior authorization is required to get your medication covered by your insurance company, please allow [place] 1-2 business days to complete this process.  Drug prices often vary depending on where the prescription is filled and some pharmacies may offer cheaper prices.  The website www.goodrx.com contains coupons for medications through different pharmacies. The prices here do not account for what the cost may be with help from insurance (it may be cheaper with your  insurance), but the website can give you the price if you did not use any insurance.  - You can print the associated coupon and take it with your prescription to the pharmacy.  - You may also stop by our office during regular business hours and pick up a GoodRx coupon card.  - If you need your prescription sent electronically to a different pharmacy, notify our office through [facility] or by phone at [phone] option 4.     Si Usted Necesita Algo Despus de Su Visita  Tambin puede enviarnos un mensaje a travs de Pharmacist, community. Por lo general respondemos a los mensajes de MyChart en el transcurso de 1 a 2 das hbiles.  Para renovar recetas, por favor pida a su farmacia que se ponga en contacto con nuestra oficina. Harland Dingwall de fax es [place] [phone].  Si tiene un asunto urgente cuando la clnica est cerrada y que no puede esperar hasta el siguiente da hbil, puede llamar/localizar a su doctor(a) al nmero que aparece a continuacin.   Por favor, tenga en cuenta que aunque hacemos todo lo  posible para estar disponibles para asuntos urgentes fuera del horario de [place], no estamos disponibles las 24 horas del da, los 7 das de la [place].   Si tiene un problema urgente y no puede comunicarse con nosotros, puede optar por buscar atencin mdica  en el consultorio de su doctor(a), en una clnica privada, en un centro de atencin urgente o en una sala de emergencias.  Si tiene Engineering geologist, por favor llame inmediatamente al 911 o vaya a la sala de emergencias.  Nmeros de bper  - Dr. Nehemiah Massed: [phone]  - Dra. Moye: [phone]  - Dra. Nicole Kindred: [phone]  En caso de inclemencias del [place], por favor llame a Johnsie Kindred principal al [phone] para una actualizacin sobre el [place] de cualquier retraso o cierre.  Consejos para la medicacin en dermatologa: Por favor, guarde las cajas en las que vienen los medicamentos de uso tpico para ayudarle a seguir las instrucciones sobre dnde y cmo usarlos. Las farmacias generalmente imprimen las instrucciones del medicamento slo en las cajas y no directamente en los tubos del [place].   Si su medicamento es muy caro, por favor, pngase en contacto con Zigmund Daniel llamando al [phone] y presione la opcin 4 o envenos un mensaje a travs de Pharmacist, community.   No podemos decirle cul ser su copago por los medicamentos por adelantado ya que esto es diferente dependiendo de la cobertura de su seguro. Sin embargo, es posible que podamos encontrar un medicamento sustituto a Electrical engineer un formulario para que el seguro cubra el medicamento que se considera necesario.   Si se requiere una autorizacin previa para que su compaa de seguros [place] su medicamento, por favor permtanos de 1 a 2 das hbiles para completar este proceso.  Los precios de los medicamentos varan con frecuencia dependiendo del Environmental consultant de dnde se surte la receta y alguna farmacias pueden ofrecer precios ms baratos.  El sitio web  www.goodrx.com tiene cupones para medicamentos de Airline pilot. Los precios aqu no tienen en cuenta lo que podra costar con la ayuda del seguro (puede ser ms barato con su seguro), pero el sitio web puede darle el precio si no utiliz Research scientist (physical sciences).  - Puede imprimir el cupn correspondiente y llevarlo con su receta a la farmacia.  - Tambin puede pasar por nuestra oficina durante el horario de atencin regular y Charity fundraiser una tarjeta de  cupones de GoodRx.  - Si necesita que su receta se enve electrnicamente a una farmacia diferente, informe a nuestra oficina a travs de MyChart de [place] o por telfono llamando al [phone] y presione la opcin 4.

## 2022-02-25 NOTE — Progress Notes (Signed)
Follow-Up Visit   Subjective  Audrey Fletcher is a 81 y.o. female who presents for the following: Annual Exam.  The patient presents for Total-Body Skin Exam (TBSE) for skin cancer screening and mole check.  The patient has spots, moles and lesions to be evaluated, some may be new or changing and the patient has concerns that these could be cancer. She has a history of SCC of the left nose in 2014. She has a spot of concern on her right nose x 3 months. Also a spot to check on her left temple. She has rough spots on her arms,hands, and upper back.   The following portions of the chart were reviewed this encounter and updated as appropriate:       Review of Systems:  No other skin or systemic complaints except as noted in HPI or Assessment and Plan.  Objective  Well appearing patient in no apparent distress; mood and affect are within normal limits.  A full examination was performed including scalp, head, eyes, ears, nose, lips, neck, chest, axillae, abdomen, back, buttocks, bilateral upper extremities, bilateral lower extremities, hands, feet, fingers, toes, fingernails, and toenails. All findings within normal limits unless otherwise noted below.  L mid forearm x 1 Erythematous stuck-on, waxy papule  right great toenail Yellow white discoloration, thickening, onycholysis, and subungual debris.     central lower lip 2.5 mm gray brown macule         Assessment & Plan  Skin cancer screening performed today.  Actinic Damage - chronic, secondary to cumulative UV radiation exposure/sun exposure over time - diffuse scaly erythematous macules with underlying dyspigmentation - Recommend daily broad spectrum sunscreen SPF 30+ to sun-exposed areas, reapply every 2 hours as needed.  - Recommend staying in the shade or wearing long sleeves, sun glasses (UVA+UVB protection) and wide brim hats (4-inch brim around the entire circumference of the hat). - Call for new or changing  lesions.  Lentigines - Scattered tan macules - Due to sun exposure - Benign-appering, observe - Recommend daily broad spectrum sunscreen SPF 30+ to sun-exposed areas, reapply every 2 hours as needed. - Call for any changes  Seborrheic Keratoses - Stuck-on, waxy, tan-brown papules face, nose, arms, back - Benign-appearing - Discussed benign etiology and prognosis. - Observe - Call for any changes  History of Squamous Cell Carcinoma of the Skin - No evidence of recurrence today of the left nose - Recommend regular full body skin exams - Recommend daily broad spectrum sunscreen SPF 30+ to sun-exposed areas, reapply every 2 hours as needed.  - Call if any new or changing lesions are noted between office visits  Purpura - Chronic; persistent and recurrent.  Treatable, but not curable. - Violaceous macules and patches - Benign - Related to trauma, age, sun damage and/or use of blood thinners, chronic use of topical and/or oral steroids - Observe - Can use OTC arnica containing moisturizer such as Dermend Bruise Formula if desired - Call for worsening or other concerns  Xerosis - diffuse xerotic patches - recommend gentle, hydrating skin care - gentle skin care handout given  Milia - tiny firm white papules of the left lateral canthus - type of cyst - benign - may be extracted if symptomatic - observe -Sample of Effaclar (adapalene), apply qhs to AA.  Inflamed seborrheic keratosis L mid forearm x 1  Symptomatic, irritating, patient would like treated.  Destruction of lesion - L mid forearm x 1  Destruction method: cryotherapy   Informed consent:  discussed and consent obtained   Lesion destroyed using liquid nitrogen: Yes   Region frozen until ice ball extended beyond lesion: Yes   Outcome: patient tolerated procedure well with no complications   Post-procedure details: wound care instructions given   Additional details:  Prior to procedure, discussed risks of blister  formation, small wound, skin dyspigmentation, or rare scar following cryotherapy. Recommend Vaseline ointment to treated areas while healing.   Tinea unguium right great toenail  Chronic and persistent condition with duration or expected duration over one year. Condition is symptomatic/ bothersome to patient.  Labs (LFTs) from 07/16/2021 wnl.   Start terbinafine '250mg'$  take 1 PO qd with food dsp #30 0Rf.  Will check labs on f/u.  Terbinafine Counseling  Terbinafine is an anti-fungal medicine that can be applied to the skin (over the counter) or taken by mouth (prescription) to treat fungal infections. The pill version is often used to treat fungal infections of the nails or scalp. While most people do not have any side effects from taking terbinafine pills, some possible side effects of the medicine can include taste changes, headache, loss of smell, vision changes, nausea, vomiting, or diarrhea.   Rare side effects can include irritation of the liver, allergic reaction, or decrease in blood counts (which may show up as not feeling well or developing an infection). If you are concerned about any of these side effects, please stop the medicine and call your doctor, or in the case of an emergency such as feeling very unwell, seek immediate medical care.    terbinafine (LAMISIL) 250 MG tablet - right great toenail Take 1 tablet (250 mg total) by mouth daily. Take with food.  Lentigo central lower lip  Benign-appearing.  Observation.  Call clinic for new or changing lesions.  Recommend daily use of broad spectrum spf 30+ sunscreen to sun-exposed areas.    Hemangiomas - Red papules - Discussed benign nature - Observe - Call for any changes  Return in about 1 month (around 03/27/2022) for tinea f/u.  IJamesetta Orleans, CMA, am acting as scribe for Brendolyn Patty, MD . Documentation: I have reviewed the above documentation for accuracy and completeness, and I agree with the above.  Brendolyn Patty MD

## 2022-03-25 ENCOUNTER — Other Ambulatory Visit: Payer: Self-pay

## 2022-03-25 ENCOUNTER — Emergency Department (HOSPITAL_COMMUNITY)
Admission: EM | Admit: 2022-03-25 | Discharge: 2022-03-25 | Disposition: A | Discharge status: Home / Self Care | Payer: Medicare HMO | Attending: Emergency Medicine | Admitting: Emergency Medicine

## 2022-03-25 ENCOUNTER — Encounter (HOSPITAL_COMMUNITY): Payer: Self-pay

## 2022-03-25 ENCOUNTER — Ambulatory Visit: Payer: Medicare HMO | Admitting: Dermatology

## 2022-03-25 ENCOUNTER — Emergency Department (HOSPITAL_COMMUNITY): Payer: Medicare HMO

## 2022-03-25 DIAGNOSIS — B351 Tinea unguium: Secondary | ICD-10-CM | POA: Diagnosis not present

## 2022-03-25 DIAGNOSIS — W01198A Fall on same level from slipping, tripping and stumbling with subsequent striking against other object, initial encounter: Secondary | ICD-10-CM | POA: Insufficient documentation

## 2022-03-25 DIAGNOSIS — Z5181 Encounter for therapeutic drug level monitoring: Secondary | ICD-10-CM

## 2022-03-25 DIAGNOSIS — Z23 Encounter for immunization: Secondary | ICD-10-CM | POA: Diagnosis not present

## 2022-03-25 DIAGNOSIS — Z7982 Long term (current) use of aspirin: Secondary | ICD-10-CM | POA: Insufficient documentation

## 2022-03-25 DIAGNOSIS — Z79899 Other long term (current) drug therapy: Secondary | ICD-10-CM | POA: Insufficient documentation

## 2022-03-25 DIAGNOSIS — S0181XA Laceration without foreign body of other part of head, initial encounter: Secondary | ICD-10-CM | POA: Diagnosis not present

## 2022-03-25 DIAGNOSIS — I130 Hypertensive heart and chronic kidney disease with heart failure and stage 1 through stage 4 chronic kidney disease, or unspecified chronic kidney disease: Secondary | ICD-10-CM | POA: Insufficient documentation

## 2022-03-25 DIAGNOSIS — Y92481 Parking lot as the place of occurrence of the external cause: Secondary | ICD-10-CM | POA: Diagnosis not present

## 2022-03-25 DIAGNOSIS — W19XXXA Unspecified fall, initial encounter: Secondary | ICD-10-CM

## 2022-03-25 DIAGNOSIS — N189 Chronic kidney disease, unspecified: Secondary | ICD-10-CM | POA: Insufficient documentation

## 2022-03-25 DIAGNOSIS — S0990XA Unspecified injury of head, initial encounter: Secondary | ICD-10-CM | POA: Diagnosis present

## 2022-03-25 DIAGNOSIS — S060X0A Concussion without loss of consciousness, initial encounter: Secondary | ICD-10-CM

## 2022-03-25 DIAGNOSIS — I509 Heart failure, unspecified: Secondary | ICD-10-CM | POA: Diagnosis not present

## 2022-03-25 LAB — I-STAT CHEM 8, ED
BUN: 21 mg/dL (ref 8–23)
Calcium, Ion: 1.05 mmol/L — ABNORMAL LOW (ref 1.15–1.40)
Chloride: 99 mmol/L (ref 98–111)
Creatinine, Ser: 1.1 mg/dL — ABNORMAL HIGH (ref 0.44–1.00)
Glucose, Bld: 107 mg/dL — ABNORMAL HIGH (ref 70–99)
HCT: 45 % (ref 36.0–46.0)
Hemoglobin: 15.3 g/dL — ABNORMAL HIGH (ref 12.0–15.0)
Potassium: 3.5 mmol/L (ref 3.5–5.1)
Sodium: 139 mmol/L (ref 135–145)
TCO2: 29 mmol/L (ref 22–32)

## 2022-03-25 LAB — COMPREHENSIVE METABOLIC PANEL
ALT: 12 U/L (ref 0–44)
AST: 26 U/L (ref 15–41)
Albumin: 4.5 g/dL (ref 3.5–5.0)
Alkaline Phosphatase: 69 U/L (ref 38–126)
Anion gap: 11 (ref 5–15)
BUN: 20 mg/dL (ref 8–23)
CO2: 31 mmol/L (ref 22–32)
Calcium: 8.9 mg/dL (ref 8.9–10.3)
Chloride: 97 mmol/L — ABNORMAL LOW (ref 98–111)
Creatinine, Ser: 1.15 mg/dL — ABNORMAL HIGH (ref 0.44–1.00)
GFR, Estimated: 48 mL/min — ABNORMAL LOW (ref >60)
Glucose, Bld: 109 mg/dL — ABNORMAL HIGH (ref 70–99)
Potassium: 3.7 mmol/L (ref 3.5–5.1)
Sodium: 139 mmol/L (ref 135–145)
Total Bilirubin: 0.7 mg/dL (ref 0.3–1.2)
Total Protein: 7.2 g/dL (ref 6.5–8.1)

## 2022-03-25 LAB — CBC
HCT: 42.5 % (ref 36.0–46.0)
Hemoglobin: 14.2 g/dL (ref 12.0–15.0)
MCH: 32.3 pg (ref 26.0–34.0)
MCHC: 33.4 g/dL (ref 30.0–36.0)
MCV: 96.6 fL (ref 80.0–100.0)
Platelets: 206 10*3/uL (ref 150–400)
RBC: 4.4 MIL/uL (ref 3.87–5.11)
RDW: 12.4 % (ref 11.5–15.5)
WBC: 10.8 10*3/uL — ABNORMAL HIGH (ref 4.0–10.5)
nRBC: 0 % (ref 0.0–0.2)

## 2022-03-25 LAB — ETHANOL: Alcohol, Ethyl (B): <10 mg/dL (ref <10)

## 2022-03-25 MED ORDER — LACTATED RINGERS IV BOLUS
500.0000 mL | Freq: Once | INTRAVENOUS | Status: AC
  Administered 2022-03-25: 500 mL via INTRAVENOUS

## 2022-03-25 MED ORDER — HYDROCODONE-ACETAMINOPHEN 10-325 MG PO TABS
0.5000 | ORAL_TABLET | ORAL | 0 refills | Status: AC | PRN
Start: 2022-03-25 — End: 2022-03-29

## 2022-03-25 MED ORDER — LIDOCAINE-EPINEPHRINE (PF) 2 %-1:200000 IJ SOLN
10.0000 mL | Freq: Once | INTRAMUSCULAR | Status: AC
  Administered 2022-03-25: 10 mL via INTRADERMAL
  Filled 2022-03-25: qty 20

## 2022-03-25 MED ORDER — METOCLOPRAMIDE HCL 5 MG/ML IJ SOLN
5.0000 mg | Freq: Once | INTRAMUSCULAR | Status: AC
  Administered 2022-03-25: 5 mg via INTRAVENOUS
  Filled 2022-03-25: qty 2

## 2022-03-25 MED ORDER — HYDROCODONE-ACETAMINOPHEN 10-325 MG PO TABS: 1.0000 | ORAL_TABLET | Freq: Once | ORAL | Status: DC

## 2022-03-25 MED ORDER — HYDROCODONE-ACETAMINOPHEN 5-325 MG PO TABS
1.0000 | ORAL_TABLET | Freq: Once | ORAL | Status: AC
  Administered 2022-03-25: 1 via ORAL
  Filled 2022-03-25: qty 1

## 2022-03-25 MED ORDER — DIPHENHYDRAMINE HCL 50 MG/ML IJ SOLN
12.5000 mg | Freq: Once | INTRAMUSCULAR | Status: AC
  Administered 2022-03-25: 12.5 mg via INTRAVENOUS
  Filled 2022-03-25: qty 1

## 2022-03-25 MED ORDER — TETANUS-DIPHTH-ACELL PERTUSSIS 5-2.5-18.5 LF-MCG/0.5 IM SUSY
0.5000 mL | PREFILLED_SYRINGE | Freq: Once | INTRAMUSCULAR | Status: AC
  Administered 2022-03-25: 0.5 mL via INTRAMUSCULAR
  Filled 2022-03-25: qty 0.5

## 2022-03-25 MED ORDER — CICLOPIROX OLAMINE 0.77 % EX SUSP: 1.0000 | Freq: Every day | CUTANEOUS | 2 refills | Status: DC

## 2022-03-25 MED ORDER — ACETAMINOPHEN 325 MG PO TABS: 650.0000 mg | ORAL_TABLET | Freq: Once | ORAL | Status: DC

## 2022-03-25 MED ORDER — LIDOCAINE-EPINEPHRINE-TETRACAINE (LET) TOPICAL GEL
3.0000 mL | Freq: Once | TOPICAL | Status: AC
  Administered 2022-03-25: 3 mL via TOPICAL
  Filled 2022-03-25: qty 3

## 2022-03-25 NOTE — ED Triage Notes (Addendum)
Pt bib [facility] from the [place] parking lot where she was pushing the shopping cart and fell over. Pt states she hit her forehead and left wrist on the pavement while trying to catch herself. Pt arrives AOx4, denies loc, with laceration to left forehead with bleeding controlled by EMS bandage. EMS VSS.

## 2022-03-25 NOTE — Discharge Instructions (Signed)
You received 9 running stitches in the upper wound on your forehead and 9 interrupted stitches on the larger, lower wound.  The stitches will need to be removed in 7 to 10 days.  Keep area clean and avoid any further injuries.  If area of wounds become increasingly painful, swollen, red, or begin draining pus, please return to the emergency department.  Avoid activities that worsen headache or post concussion symptoms.  Take  headache medications as needed.

## 2022-03-25 NOTE — ED Provider Notes (Signed)
[facility] EMERGENCY DEPARTMENT Provider Note   CSN: [id number] Arrival date & time: 03/25/22  1302     History  Chief Complaint  Patient presents with   Alexsa Flaum is a 81 y.o. female.   Fall Associated symptoms include headaches.  Patient presents for a ground-level fall.  Medical history includes CKD, CVA, depression, HTN, GERD, HLD, CHF, migraine headaches, and anxiety.  She describes the fall as mechanical.  Prior to arrival, she was shopping at [place].  She had a migraine headache at that time and was going to get a soda from the vending machine.  While pushing her buggy, one of the wheels moved off of the curb.  This caused her to fall forward.  She fell to the ground and struck her anterior right head and face on the concrete.  She sustained a laceration to her forehead.  EMS was called to the scene.  EMS noted periorbital bruise already formed and she had ongoing bleeding from her forehead wound.  They applied a pressure bandage.  They noted skin tear to her left hand.  She has been alert and oriented throughout transit to the hospital.  Her vital signs have been notable for hypertension only.  Patient takes a daily aspirin but is not on any other blood thinners.  She denies any areas of pain other than a headache.  Headache is similar to the headache she had prior to her fall.     Home Medications Prior to Admission medications   Medication Sig Start Date End Date Taking? Authorizing Provider  alendronate (FOSAMAX) 70 MG tablet Take 1 tablet by mouth once a week. Patient takes on Friday    [provider]  aspirin EC 81 MG tablet Take 1 tablet by mouth at bedtime.  04/21/17   [provider]  atorvastatin (LIPITOR) 20 MG tablet Take 1 tablet by mouth at bedtime.  11/29/13   [provider]  butalbital-acetaminophen-caffeine (FIORICET, ESGIC) 50-325-40 MG tablet Take 1-2 tablets by mouth 4 (four) times daily. Takes 2  tablets at bedtime    [provider]  calcium citrate (CALCITRATE - DOSED IN MG ELEMENTAL CALCIUM) 950 MG tablet Take 200 mg of elemental calcium by mouth 2 (two) times daily. Takes at noon and dinner time    [provider]  cetirizine (ZYRTEC) 10 MG tablet Take 1 tablet by mouth at bedtime.     [provider]  ciclopirox (LOPROX) 0.77 % SUSP Apply 1 Application topically at bedtime. Apply to affected nail , remove weekly with nail polish remover 03/25/22   Brendolyn Patty, MD  citalopram (CELEXA) 40 MG tablet Take 1 tablet by mouth daily.     [provider]  cyanocobalamin ([employer] VITAMIN B12) 2000 MCG tablet Take 1 tablet by mouth daily at 12 noon.     [provider]  cyclobenzaprine (FLEXERIL) 10 MG tablet Take 1 tablet by mouth 3 (three) times daily. 10/09/11   [provider]  esomeprazole (NEXIUM) 10 MG packet Take 10 mg by mouth at bedtime.     [provider]  fluticasone (FLONASE) 50 MCG/ACT nasal spray Place 2 sprays into the nose 2 (two) times daily.     [provider]  furosemide (LASIX) 40 MG tablet Take 1 tablet by mouth daily at 12 noon.     [provider]  HYDROcodone-acetaminophen (NORCO) 10-325 MG tablet Take 0.5 tablets by mouth every 6 (six) hours as needed. 11/26/17  Henreitta Leber, MD  meclizine (ANTIVERT) 25 MG tablet Take 25 mg by mouth every 6 (six) hours as needed for dizziness.  01/02/17   [provider]  metoprolol succinate (TOPROL-XL) 25 MG 24 hr tablet Take 25 mg by mouth at bedtime.  11/27/16 11/27/17  [provider]  ondansetron (ZOFRAN) 4 MG tablet TAKE 1 TABLET EVERY 8 HOURS AS NEEDED FOR NAUSEA 11/05/15   [provider]  potassium chloride (K-DUR,KLOR-CON) 10 MEQ tablet Take 10 mEq by mouth 2 (two) times daily. Takes at noon and dinner time 10/07/17   [provider]  SYNTHROID 100 MCG tablet Take 100 mcg by mouth as directed. Takes every day except  Monday and Friday 07/20/17   [provider]  terbinafine (LAMISIL) 250 MG tablet Take 1 tablet (250 mg total) by mouth daily. Take with food. 02/25/22   Brendolyn Patty, MD  traZODone (DESYREL) 100 MG tablet Take 1 tablet by mouth at bedtime. 02/12/17   [provider]      Allergies    Nsaids, Tolmetin, Bacitracin, Fexofenadine hcl, Oxycodone, Sulfa antibiotics, Baclofen, Codeine, Hydromorphone, Sulfasalazine, and Tetracyclines & related    Review of Systems   Review of Systems  Musculoskeletal:  Positive for arthralgias.  Skin:  Positive for wound.  Neurological:  Positive for headaches.  All other systems reviewed and are negative.   Physical Exam Updated Vital Signs BP (!) 181/74   Pulse 67   Temp 98.9 F (37.2 C)   Resp 18   SpO2 96%  Physical Exam Vitals and nursing note reviewed.  Constitutional:      General: She is not in acute distress.    Appearance: Normal appearance. She is well-developed. She is not ill-appearing, toxic-appearing or diaphoretic.  HENT:     Head: Normocephalic.     Comments: Stellate hemostatic wound to left forehead.  Abrasion to left cheek.      Right Ear: External ear normal.     Left Ear: External ear normal.     Nose: Nose normal.     Mouth/Throat:     Mouth: Mucous membranes are moist.     Pharynx: Oropharynx is clear.  Eyes:     Extraocular Movements: Extraocular movements intact.     Conjunctiva/sclera: Conjunctivae normal.  Cardiovascular:     Rate and Rhythm: Normal rate and regular rhythm.     Heart sounds: No murmur heard. Pulmonary:     Effort: Pulmonary effort is normal. No respiratory distress.     Breath sounds: Normal breath sounds. No wheezing or rales.  Chest:     Chest wall: No tenderness.  Abdominal:     Palpations: Abdomen is soft.     Tenderness: There is no abdominal tenderness.  Musculoskeletal:        General: Tenderness (Second right MCP) present. No swelling. Normal range of motion.      Cervical back: Neck supple. Tenderness present.     Right lower leg: No edema.     Left lower leg: No edema.  Skin:    General: Skin is warm and dry.     Capillary Refill: Capillary refill takes less than 2 seconds.     Coloration: Skin is not jaundiced or pale.     Comments: Superficial laceration to base of left thumb  Neurological:     General: No focal deficit present.     Mental Status: She is alert and oriented to person, place, and time.     Cranial Nerves:  No cranial nerve deficit.     Sensory: No sensory deficit.     Motor: No weakness.     Coordination: Coordination normal.  Psychiatric:        Mood and Affect: Mood normal.        Behavior: Behavior normal.        Thought Content: Thought content normal.        Judgment: Judgment normal.      ED Results / Procedures / Treatments   Labs (all labs ordered are listed, but only abnormal results are displayed) Labs Reviewed  COMPREHENSIVE METABOLIC PANEL - Abnormal; Notable for the following components:      Result Value   Chloride 97 (*)    Glucose, Bld 109 (*)    Creatinine, Ser 1.15 (*)    GFR, Estimated 48 (*)    All other components within normal limits  CBC - Abnormal; Notable for the following components:   WBC 10.8 (*)    All other components within normal limits  I-STAT CHEM 8, ED - Abnormal; Notable for the following components:   Creatinine, Ser 1.10 (*)    Glucose, Bld 107 (*)    Calcium, Ion 1.05 (*)    Hemoglobin 15.3 (*)    All other components within normal limits  ETHANOL    EKG None  Radiology DG Hand 2 View Right  Result Date: 03/25/2022 CLINICAL DATA:  Fall. EXAM: RIGHT HAND - 2 VIEW COMPARISON:  Right wrist radiographs 11/24/2017 FINDINGS: IV tubing overlies the right hand. Advanced degenerative changes are again noted at the first [facility] joint. No acute fractures are present. Mild osteopenia noted. Moderate degenerative changes are also present in the distal interphalangeal joints.  IMPRESSION: 1. No acute abnormality. 2. Advanced degenerative changes at the first [facility] joint and DIP joints. Electronically Signed   By: San Morelle M.D.   On: 03/25/2022 15:12   CT HEAD WO CONTRAST  Result Date: 03/25/2022 CLINICAL DATA:  Head trauma moderate to severe.  Fall. EXAM: CT HEAD WITHOUT CONTRAST CT MAXILLOFACIAL WITHOUT CONTRAST CT CERVICAL SPINE WITHOUT CONTRAST TECHNIQUE: Multidetector CT imaging of the head, cervical spine, and maxillofacial structures were performed using the standard protocol without intravenous contrast. Multiplanar CT image reconstructions of the cervical spine and maxillofacial structures were also generated. RADIATION DOSE REDUCTION: This exam was performed according to the departmental dose-optimization program which includes automated exposure control, adjustment of the mA and/or kV according to patient size and/or use of iterative reconstruction technique. COMPARISON:  Chest CT 11/26/2017 FINDINGS: CT HEAD FINDINGS Brain: No acute intracranial hemorrhage. No focal mass lesion. No CT evidence of acute infarction. No midline shift or mass effect. No hydrocephalus. Basilar cisterns are patent. There are periventricular and subcortical white matter hypodensities. Generalized cortical atrophy. Vascular: No hyperdense vessel or unexpected calcification. Skull: Normal. Negative for fracture or focal lesion. Sinuses/Orbits: Paranasal sinuses and mastoid air cells are clear. Orbits are clear. Other: None. CT MAXILLOFACIAL FINDINGS Osseous: No fracture or mandibular dislocation. No destructive process. Orbits: Negative. No traumatic or inflammatory finding. Sinuses: Clear. Soft tissues: Small subcutaneous hematoma over the superolateral aspect of the LEFT orbit (image 66/3). No associated fracture CT CERVICAL SPINE FINDINGS Alignment: Normal alignment of the cervical vertebral bodies. Skull base and vertebrae: Normal craniocervical junction. No loss of vertebral body  height or disc height. Normal facet articulation. No evidence of fracture. Benign cystic change within the dens. bulky facet hypertrophy on the LEFT at C3-C4. Soft tissues and spinal canal: No prevertebral  soft tissue swelling. No perispinal or epidural hematoma. Disc levels: retrolisthesis by 3 mm of C5 on C6 favored chronic. Disc space narrowing and endplate osteophytosis at this level. Upper chest: Clear Other: Upper lungs clear. Small focus of gas adjacent to the trachea posterior RIGHT on image 72/5 is favored a small tracheal diverticulum. No change from prior CT 11/26/2017 IMPRESSION: 1. No intracranial trauma. 2. Small scalp hematoma superolateral LEFT orbit. No evidence skull fracture. No orbital injury. 3. No facial bone fracture. 4. No cervical spine fracture. 5. Multilevel disc osteophytic disease. Electronically Signed   By: Suzy Bouchard M.D.   On: 03/25/2022 14:38   CT MAXILLOFACIAL WO CONTRAST  Result Date: 03/25/2022 CLINICAL DATA:  Head trauma moderate to severe.  Fall. EXAM: CT HEAD WITHOUT CONTRAST CT MAXILLOFACIAL WITHOUT CONTRAST CT CERVICAL SPINE WITHOUT CONTRAST TECHNIQUE: Multidetector CT imaging of the head, cervical spine, and maxillofacial structures were performed using the standard protocol without intravenous contrast. Multiplanar CT image reconstructions of the cervical spine and maxillofacial structures were also generated. RADIATION DOSE REDUCTION: This exam was performed according to the departmental dose-optimization program which includes automated exposure control, adjustment of the mA and/or kV according to patient size and/or use of iterative reconstruction technique. COMPARISON:  Chest CT 11/26/2017 FINDINGS: CT HEAD FINDINGS Brain: No acute intracranial hemorrhage. No focal mass lesion. No CT evidence of acute infarction. No midline shift or mass effect. No hydrocephalus. Basilar cisterns are patent. There are periventricular and subcortical white matter  hypodensities. Generalized cortical atrophy. Vascular: No hyperdense vessel or unexpected calcification. Skull: Normal. Negative for fracture or focal lesion. Sinuses/Orbits: Paranasal sinuses and mastoid air cells are clear. Orbits are clear. Other: None. CT MAXILLOFACIAL FINDINGS Osseous: No fracture or mandibular dislocation. No destructive process. Orbits: Negative. No traumatic or inflammatory finding. Sinuses: Clear. Soft tissues: Small subcutaneous hematoma over the superolateral aspect of the LEFT orbit (image 66/3). No associated fracture CT CERVICAL SPINE FINDINGS Alignment: Normal alignment of the cervical vertebral bodies. Skull base and vertebrae: Normal craniocervical junction. No loss of vertebral body height or disc height. Normal facet articulation. No evidence of fracture. Benign cystic change within the dens. bulky facet hypertrophy on the LEFT at C3-C4. Soft tissues and spinal canal: No prevertebral soft tissue swelling. No perispinal or epidural hematoma. Disc levels: retrolisthesis by 3 mm of C5 on C6 favored chronic. Disc space narrowing and endplate osteophytosis at this level. Upper chest: Clear Other: Upper lungs clear. Small focus of gas adjacent to the trachea posterior RIGHT on image 72/5 is favored a small tracheal diverticulum. No change from prior CT 11/26/2017 IMPRESSION: 1. No intracranial trauma. 2. Small scalp hematoma superolateral LEFT orbit. No evidence skull fracture. No orbital injury. 3. No facial bone fracture. 4. No cervical spine fracture. 5. Multilevel disc osteophytic disease. Electronically Signed   By: Suzy Bouchard M.D.   On: 03/25/2022 14:38   CT CERVICAL SPINE WO CONTRAST  Result Date: 03/25/2022 CLINICAL DATA:  Head trauma moderate to severe.  Fall. EXAM: CT HEAD WITHOUT CONTRAST CT MAXILLOFACIAL WITHOUT CONTRAST CT CERVICAL SPINE WITHOUT CONTRAST TECHNIQUE: Multidetector CT imaging of the head, cervical spine, and maxillofacial structures were performed  using the standard protocol without intravenous contrast. Multiplanar CT image reconstructions of the cervical spine and maxillofacial structures were also generated. RADIATION DOSE REDUCTION: This exam was performed according to the departmental dose-optimization program which includes automated exposure control, adjustment of the mA and/or kV according to patient size and/or use of iterative reconstruction technique. COMPARISON:  Chest CT 11/26/2017 FINDINGS: CT HEAD FINDINGS Brain: No acute intracranial hemorrhage. No focal mass lesion. No CT evidence of acute infarction. No midline shift or mass effect. No hydrocephalus. Basilar cisterns are patent. There are periventricular and subcortical white matter hypodensities. Generalized cortical atrophy. Vascular: No hyperdense vessel or unexpected calcification. Skull: Normal. Negative for fracture or focal lesion. Sinuses/Orbits: Paranasal sinuses and mastoid air cells are clear. Orbits are clear. Other: None. CT MAXILLOFACIAL FINDINGS Osseous: No fracture or mandibular dislocation. No destructive process. Orbits: Negative. No traumatic or inflammatory finding. Sinuses: Clear. Soft tissues: Small subcutaneous hematoma over the superolateral aspect of the LEFT orbit (image 66/3). No associated fracture CT CERVICAL SPINE FINDINGS Alignment: Normal alignment of the cervical vertebral bodies. Skull base and vertebrae: Normal craniocervical junction. No loss of vertebral body height or disc height. Normal facet articulation. No evidence of fracture. Benign cystic change within the dens. bulky facet hypertrophy on the LEFT at C3-C4. Soft tissues and spinal canal: No prevertebral soft tissue swelling. No perispinal or epidural hematoma. Disc levels: retrolisthesis by 3 mm of C5 on C6 favored chronic. Disc space narrowing and endplate osteophytosis at this level. Upper chest: Clear Other: Upper lungs clear. Small focus of gas adjacent to the trachea posterior RIGHT on image  72/5 is favored a small tracheal diverticulum. No change from prior CT 11/26/2017 IMPRESSION: 1. No intracranial trauma. 2. Small scalp hematoma superolateral LEFT orbit. No evidence skull fracture. No orbital injury. 3. No facial bone fracture. 4. No cervical spine fracture. 5. Multilevel disc osteophytic disease. Electronically Signed   By: Suzy Bouchard M.D.   On: 03/25/2022 14:38   DG Hand Complete Left  Result Date: 03/25/2022 CLINICAL DATA:  Fall.  Left wrist pain EXAM: LEFT HAND - COMPLETE 3+ VIEW; LEFT WRIST - COMPLETE 3+ VIEW COMPARISON:  None Available. FINDINGS: Diffuse osseous demineralization. Slight contour deformity along the peripheral margin of the distal radius at the base of the radial styloid, potentially representing a nondisplaced fracture. There also appears to be a nondisplaced fracture through the ulnar styloid. Remaining osseous structures appear otherwise intact. Severe arthropathy of the hand and wrist most notably at the first [facility] joint, triscaphe joint, and interphalangeal joints. Chondrocalcinosis at the TFCC. No focal soft tissue swelling. IMPRESSION: 1. Diffuse osseous demineralization with possible nondisplaced fractures involving the radial styloid and ulnar styloid. Correlate with point tenderness. 2. Severe arthropathy of the hand and wrist, as described. Electronically Signed   By: Davina Poke D.O.   On: 03/25/2022 14:10   DG Wrist Complete Left  Result Date: 03/25/2022 CLINICAL DATA:  Fall.  Left wrist pain EXAM: LEFT HAND - COMPLETE 3+ VIEW; LEFT WRIST - COMPLETE 3+ VIEW COMPARISON:  None Available. FINDINGS: Diffuse osseous demineralization. Slight contour deformity along the peripheral margin of the distal radius at the base of the radial styloid, potentially representing a nondisplaced fracture. There also appears to be a nondisplaced fracture through the ulnar styloid. Remaining osseous structures appear otherwise intact. Severe arthropathy of the hand and  wrist most notably at the first [facility] joint, triscaphe joint, and interphalangeal joints. Chondrocalcinosis at the TFCC. No focal soft tissue swelling. IMPRESSION: 1. Diffuse osseous demineralization with possible nondisplaced fractures involving the radial styloid and ulnar styloid. Correlate with point tenderness. 2. Severe arthropathy of the hand and wrist, as described. Electronically Signed   By: Davina Poke D.O.   On: 03/25/2022 14:10   DG Pelvis 1-2 Views  Result Date: 03/25/2022 CLINICAL DATA:  Trauma, fall EXAM:  PELVIS - 1-2 VIEW COMPARISON:  CT done 11/25/2017. FINDINGS: Deformity in left pubic bone without radiolucent line most likely is old healed fracture. No definite recent fracture or dislocation is seen. Small smoothly marginated calcification adjacent to the upper margin of greater trochanter of right femur may be residual from previous injury. Bony spurs are noted in both hips. Postsurgical changes are noted in the visualized lower lumbar spine. IMPRESSION: No recent fracture or dislocation is seen in pelvis. Electronically Signed   By: Elmer Picker M.D.   On: 03/25/2022 14:10   DG Chest 1 View  Result Date: 03/25/2022 CLINICAL DATA:  Fall in a 80 year old female. EXAM: CHEST  1 VIEW COMPARISON:  October 09, 2017. FINDINGS: Trachea midline. Cardiomediastinal contours and hilar structures are normal. No pneumothorax. No consolidation. No pleural effusion on frontal radiograph. Irregularity of LEFT anterolateral second and third ribs suggesting prior fracture with callus formation. Similar appearance of RIGHT anterolateral second rib. Signs of spinal fusion in the lumbar spine are incompletely assessed. Post RIGHT shoulder arthroplasty. IMPRESSION: 1. No active cardiopulmonary disease. 2. Signs of prior rib fracture involving LEFT second and third ribs and RIGHT second rib. Electronically Signed   By: Zetta Bills M.D.   On: 03/25/2022 14:08    Procedures .Marland KitchenLaceration  Repair  Date/Time: 03/25/2022 4:51 PM  Performed by: Godfrey Pick, MD Authorized by: Godfrey Pick, MD   Consent:    Consent obtained:  Verbal   Consent given by:  Patient   Risks, benefits, and alternatives were discussed: yes     Risks discussed:  Infection, pain and poor cosmetic result   Alternatives discussed:  No treatment Universal protocol:    Procedure explained and questions answered to patient or proxy's satisfaction: yes     Imaging studies available: yes     Patient identity confirmed:  Verbally with patient Anesthesia:    Anesthesia method:  Topical application and local infiltration   Topical anesthetic:  LET   Local anesthetic:  Lidocaine 2% WITH epi Laceration details:    Location:  Face   Face location:  Forehead   Length (cm):  16   Depth (mm):  3 Pre-procedure details:    Preparation:  Patient was prepped and draped in usual sterile fashion and imaging obtained to evaluate for foreign bodies Exploration:    Imaging obtained comment:  CT   Imaging outcome: foreign body not noted     Wound exploration: wound explored through full range of motion and entire depth of wound visualized     Contaminated: no   Treatment:    Area cleansed with:  Saline   Amount of cleaning:  Standard   Irrigation solution:  Sterile saline   Irrigation volume:  100   Irrigation method:  Syringe   Visualized foreign bodies/material removed: no   Skin repair:    Repair method:  Sutures   Suture size:  5-0   Suture material:  Nylon   Number of sutures:  18 Approximation:    Approximation:  Close Repair type:    Repair type:  Complex Post-procedure details:    Dressing: Xeroform and gauze.   Procedure completion:  Tolerated well, no immediate complications     Medications Ordered in ED Medications  acetaminophen (TYLENOL) tablet 650 mg (650 mg Oral Not Given 03/25/22 1547)  Tdap (BOOSTRIX) injection 0.5 mL (0.5 mLs Intramuscular Given 03/25/22 1423)  HYDROcodone-acetaminophen  (NORCO/VICODIN) 5-325 MG per tablet 1 tablet (1 tablet Oral Given 03/25/22 1507)  metoCLOPramide (REGLAN) injection  5 mg (5 mg Intravenous Given 03/25/22 1509)  diphenhydrAMINE (BENADRYL) injection 12.5 mg (12.5 mg Intravenous Given 03/25/22 1509)  lactated ringers bolus 500 mL (500 mLs Intravenous New Bag/Given 03/25/22 1526)  lidocaine-EPINEPHrine-tetracaine (LET) topical gel (3 mLs Topical Given 03/25/22 1515)  lidocaine-EPINEPHrine (XYLOCAINE W/EPI) 2 %-1:200000 (PF) injection 10 mL (10 mLs Intradermal Given 03/25/22 1556)    ED Course/ Medical Decision Making/ A&P                           Medical Decision Making Amount and/or Complexity of Data Reviewed Labs: ordered. Radiology: ordered.  Risk OTC drugs. Prescription drug management.   This patient presents to the ED for concern of fall, this involves an extensive number of treatment options, and is a complaint that carries with it a high risk of complications and morbidity.  The differential diagnosis includes acute injuries   Co morbidities that complicate the patient evaluation  CKD, CVA, depression, HTN, GERD, HLD, CHF, migraine headaches, and anxiety   Additional history obtained:  Additional history obtained from EMS External records from outside source obtained and reviewed including EMR   Lab Tests:  I Ordered, and personally interpreted labs.  The pertinent results include: Normal hemoglobin.  Clinically significant blood loss is unlikely.   Imaging Studies ordered:  I ordered imaging studies including x-ray imaging of bilateral hands, left wrist, chest, and pelvis; CT imaging of head, cervical spine, and face I independently visualized and interpreted imaging which showed no osseous injuries.  No acute intracranial injuries.  Findings on left wrist are equivocal for possible fracture.  Patient does not have point tenderness in these areas and these are likely chronic changes. I agree with the radiologist  interpretation   Cardiac Monitoring: / EKG:  The patient was maintained on a cardiac monitor.  I personally viewed and interpreted the cardiac monitored which showed an underlying rhythm of: Sinus rhythm  Problem List / ED Course / Critical interventions / Medication management  Patient is a 81 year old female who presents after a mechanical fall.  Prior to her fall, she was experiencing a migraine headache.  She describes her fall as being caused by the shopping cart to slip out from in front of her.  She fell forward striking her face on the ground.  Patient arrives via EMS.  She has had normal mentation throughout transit.  She arrives alert and oriented.  Physical exam is notable for a stellate wound to her left forehead, and abrasion to her left cheek, a superficial laceration to the base of her left thumb, and tenderness to her right first MCP.  Patient was given Tylenol for analgesia.  On reassessment, she endorses continued headache pain.  She describes a headache as similar to her headache that occurred prior to her fall.  She was given headache cocktail.  Given her solitary kidney history, NSAIDs were avoided.  Although patient has a listed allergy to oxycodone, she does state that she tolerates hydrocodone.  Norco was provided for MSK injuries.  X-ray imaging of left wrist was equivocal for possible fractures.  On reassessment, patient does not have any point tenderness to areas of radial and ulnar styloids.  Range of motion is intact and without pain.  Left thumb wound was soaked in warm soapy water.  Let gel was applied to forehead wound.  Tetanus was updated.  X-ray imaging of right hand shows no osseous or joint injuries.  CT imaging showed no acute  bony or intracranial abnormalities.  Patient underwent laceration repair, as per procedure note above.  She tolerated this well.  Wounds on left hand are superficial.  These are to heal by second intention.  Xeroform and gauze was applied.   Patient was ambulatory in the ED with steady gait.  Labwork shows normal hemoglobin and I do not suspect clinically significant blood loss.  Patient will need suture removal in 7 to 10 days.  She was advised to return to the ED for any evidence of developing infection in areas of wounds. I ordered medication including Reglan, Benadryl, and IV fluids for migraine headache; Norco for soreness from fall.  Let gel for topical anesthesia.  Tdap for tetanus prophylaxis. Reevaluation of the patient after these medicines showed that the patient improved I have reviewed the patients home medicines and have made adjustments as needed   Social Determinants of Health:  Has access to outpatient care         Final Clinical Impression(s) / ED Diagnoses Final diagnoses:  Fall, initial encounter  Laceration of forehead, initial encounter  Concussion without loss of consciousness, initial encounter    Rx / DC Orders ED Discharge Orders     None         Godfrey Pick, MD 03/25/22 1657

## 2022-03-25 NOTE — Patient Instructions (Addendum)
Due to recent changes in healthcare laws, you may see results of your pathology and/or laboratory studies on MyChart before the doctors have had a chance to review them. We understand that in some cases there may be results that are confusing or concerning to you. Please understand that not all results are received at the same time and often the doctors may need to interpret multiple results in order to provide you with the best plan of care or course of treatment. Therefore, we ask that you please give us 2 business days to thoroughly review all your results before contacting the office for clarification. Should we see a critical lab result, you will be contacted sooner.   If You Need Anything After Your Visit  If you have any questions or concerns for your doctor, please call our main line at [phone] and press option 4 to reach your doctor's medical assistant. If no one answers, please leave a voicemail as directed and we will return your call as soon as possible. Messages left after 4 pm will be answered the following business day.   You may also send us a message via MyChart. We typically respond to MyChart messages within 1-2 business days.  For prescription refills, please ask your pharmacy to contact our office. Our fax number is [phone].  If you have an urgent issue when the clinic is closed that cannot wait until the next business day, you can page your doctor at the number below.    Please note that while we do our best to be available for urgent issues outside of office hours, we are not available 24/7.   If you have an urgent issue and are unable to reach us, you may choose to seek medical care at your doctor's office, retail clinic, urgent care center, or emergency room.  If you have a medical emergency, please immediately call 911 or go to the emergency department.  Pager Numbers  - Dr. Kowalski: [phone]  - Dr. Moye: [phone]  - Dr. Stewart:  [phone]  In the event of inclement weather, please call our main line at [phone] for an update on the status of any delays or closures.  Dermatology Medication Tips: Please keep the boxes that topical medications come in in order to help keep track of the instructions about where and how to use these. Pharmacies typically print the medication instructions only on the boxes and not directly on the medication tubes.   If your medication is too expensive, please contact our office at [phone] option 4 or send us a message through MyChart.   We are unable to tell what your co-pay for medications will be in advance as this is different depending on your insurance coverage. However, we may be able to find a substitute medication at lower cost or fill out paperwork to get insurance to cover a needed medication.   If a prior authorization is required to get your medication covered by your insurance company, please allow us 1-2 business days to complete this process.  Drug prices often vary depending on where the prescription is filled and some pharmacies may offer cheaper prices.  The website www.goodrx.com contains coupons for medications through different pharmacies. The prices here do not account for what the cost may be with help from insurance (it may be cheaper with your insurance), but the website can give you the price if you did not use any insurance.  - You can print the associated coupon and take it with   your prescription to the pharmacy.  - You may also stop by our office during regular business hours and pick up a GoodRx coupon card.  - If you need your prescription sent electronically to a different pharmacy, notify our office through [place] MyChart or by phone at [phone] option 4.     Si Usted Necesita Algo Despus de Su Visita  Tambin puede enviarnos un mensaje a travs de MyChart. Por lo general respondemos a los mensajes de MyChart en el transcurso de 1 a 2  das hbiles.  Para renovar recetas, por favor pida a su farmacia que se ponga en contacto con nuestra oficina. Nuestro nmero de fax es el [phone].  Si tiene un asunto urgente cuando la clnica est cerrada y que no puede esperar hasta el siguiente da hbil, puede llamar/localizar a su doctor(a) al nmero que aparece a continuacin.   Por favor, tenga en cuenta que aunque hacemos todo lo posible para estar disponibles para asuntos urgentes fuera del horario de oficina, no estamos disponibles las 24 horas del da, los 7 das de la semana.   Si tiene un problema urgente y no puede comunicarse con nosotros, puede optar por buscar atencin mdica  en el consultorio de su doctor(a), en una clnica privada, en un centro de atencin urgente o en una sala de emergencias.  Si tiene una emergencia mdica, por favor llame inmediatamente al 911 o vaya a la sala de emergencias.  Nmeros de bper  - Dr. Kowalski: [phone]  - Dra. Moye: [phone]  - Dra. Stewart: [phone]  En caso de inclemencias del tiempo, por favor llame a nuestra lnea principal al [phone] para una actualizacin sobre el estado de cualquier retraso o cierre.  Consejos para la medicacin en dermatologa: Por favor, guarde las cajas en las que vienen los medicamentos de uso tpico para ayudarle a seguir las instrucciones sobre dnde y cmo usarlos. Las farmacias generalmente imprimen las instrucciones del medicamento slo en las cajas y no directamente en los tubos del medicamento.   Si su medicamento es muy caro, por favor, pngase en contacto con nuestra oficina llamando al [phone] y presione la opcin 4 o envenos un mensaje a travs de MyChart.   No podemos decirle cul ser su copago por los medicamentos por adelantado ya que esto es diferente dependiendo de la cobertura de su seguro. Sin embargo, es posible que podamos encontrar un medicamento sustituto a menor costo o llenar un formulario para que el  seguro cubra el medicamento que se considera necesario.   Si se requiere una autorizacin previa para que su compaa de seguros cubra su medicamento, por favor permtanos de 1 a 2 das hbiles para completar este proceso.  Los precios de los medicamentos varan con frecuencia dependiendo del lugar de dnde se surte la receta y alguna farmacias pueden ofrecer precios ms baratos.  El sitio web www.goodrx.com tiene cupones para medicamentos de diferentes farmacias. Los precios aqu no tienen en cuenta lo que podra costar con la ayuda del seguro (puede ser ms barato con su seguro), pero el sitio web puede darle el precio si no utiliz ningn seguro.  - Puede imprimir el cupn correspondiente y llevarlo con su receta a la farmacia.  - Tambin puede pasar por nuestra oficina durante el horario de atencin regular y recoger una tarjeta de cupones de GoodRx.  - Si necesita que su receta se enve electrnicamente a una farmacia diferente, informe a nuestra oficina a travs de MyChart de [place]   o por telfono llamando al [phone] y presione la opcin 4.  

## 2022-03-25 NOTE — ED Notes (Signed)
Discharge instructions reviewed with patient. Patient denies any questions or concerns. Pt and family verbalized understanding. Pt taken out of ED in wheelchair.

## 2022-03-25 NOTE — Progress Notes (Signed)
   Follow-Up Visit   Subjective  Audrey Fletcher is a 81 y.o. female who presents for the following: Tinea (1 month follow up on right great toenail. Patient prescribed terbinafine to use for 1 month. Patient states she has noticed some improvement and did not have any side effects with medication. ).  The following portions of the chart were reviewed this encounter and updated as appropriate:      Review of Systems: No other skin or systemic complaints except as noted in HPI or Assessment and Plan.   Objective  Well appearing patient in no apparent distress; mood and affect are within normal limits.  A focused examination was performed including right great toenail. Relevant physical exam findings are noted in the Assessment and Plan.  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 Condition is symptomatic/ bothersome to patient. Not currently at goal.    Some mild improvement after 1 month.  No side effects from oral Lamisil    Start Ciclopirox 0.77 % susp - apply topically to aa toenail nightly, remove weekly with nail polish remover.  Pending LFTs, will send 2 rfs of Terbinafine 250 mg - take 1 po qd with food.   Terbinafine Counseling   Terbinafine is an anti-fungal medicine that can be applied to the skin (over the counter) or taken by mouth (prescription) to treat fungal infections. The pill version is often used to treat fungal infections of the nails or scalp. While most people do not have any side effects from taking terbinafine pills, some possible side effects of the medicine can include taste changes, headache, loss of smell, vision changes, nausea, vomiting, or diarrhea.    Rare side effects can include  irritation of the liver, allergic reaction, or decrease in blood counts (which may show up as not feeling well or developing an infection). If you are concerned about any of these side effects, please stop the medicine and call your doctor, or in the case of an emergency such as feeling very unwell, seek immediate medical care.  Hepatic Function Panel - Right Hallux Toe Nail Plate  ciclopirox (LOPROX) 0.77 % SUSP - Right Hallux Toe Nail Plate Apply 1 Application topically at bedtime. Apply to affected nail , remove weekly with nail polish remover   Return in about 2 months (around 05/26/2022) for tinea follow up. I, Ruthell Rummage, CMA, am acting as scribe for Brendolyn Patty, MD.  Documentation: I have reviewed the above documentation for accuracy and completeness, and I agree with the above.  Brendolyn Patty MD

## 2022-03-26 ENCOUNTER — Other Ambulatory Visit: Payer: Self-pay

## 2022-03-26 DIAGNOSIS — B351 Tinea unguium: Secondary | ICD-10-CM

## 2022-03-26 LAB — HEPATIC FUNCTION PANEL
ALT: 8 IU/L (ref 0–32)
AST: 15 IU/L (ref 0–40)
Albumin: 4.3 g/dL (ref 3.8–4.8)
Alkaline Phosphatase: 84 IU/L (ref 44–121)
Bilirubin Total: 0.3 mg/dL (ref 0.0–1.2)
Bilirubin, Direct: <0.1 mg/dL (ref 0.00–0.40)
Total Protein: 6.6 g/dL (ref 6.0–8.5)

## 2022-03-26 MED ORDER — TERBINAFINE HCL 250 MG PO TABS: 250.0000 mg | ORAL_TABLET | Freq: Every day | ORAL | 1 refills | Status: DC

## 2022-03-26 NOTE — Progress Notes (Signed)
Advised patient labs were wnl and 2 Rf of terbinafine '250mg'$  sent to [facility].

## 2022-04-07 ENCOUNTER — Ambulatory Visit: Payer: Medicare HMO | Admitting: Podiatry

## 2022-04-07 ENCOUNTER — Encounter: Payer: Self-pay | Admitting: Podiatry

## 2022-04-07 ENCOUNTER — Ambulatory Visit (INDEPENDENT_AMBULATORY_CARE_PROVIDER_SITE_OTHER): Payer: Medicare HMO

## 2022-04-07 DIAGNOSIS — M779 Enthesopathy, unspecified: Secondary | ICD-10-CM | POA: Diagnosis not present

## 2022-04-07 DIAGNOSIS — G5762 Lesion of plantar nerve, left lower limb: Secondary | ICD-10-CM

## 2022-04-07 DIAGNOSIS — M7752 Other enthesopathy of left foot: Secondary | ICD-10-CM

## 2022-04-07 NOTE — Progress Notes (Signed)
  Subjective:  Patient ID: Audrey Fletcher, female    DOB: 01-Apr-1941,  MRN: [id number]  Chief Complaint  Patient presents with   Foot Pain    "My foot hurts on the top of my foot and on the bottom." N - foot pain L - dorsal and plantar left  D - 10 years O - gradually worsened C - numbness when foot down and no shoes, ache A - on my feet a lot T - surgery x 2, boot, orthotics    81 y.o. female presents with the above complaint. History confirmed with patient.  Most of the pain is between the second and third toes on the left foot.  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  She was under the treatment of Dr. Donnie Mesa at [facility] ([place]), he last ordered an MRI for her and she did not follow-up with him because he has left the area and works elsewhere now.  Objective:  Physical Exam: warm, good capillary refill, no trophic changes or ulcerative lesions, normal DP and PT pulses, normal sensory exam, and well-healed surgical scar in the midfoot.  She has pain on palpation of the plantar second interspace.  No direct pain on palpation to the plantar plate or metatarsal heads second or third   Radiographs: Multiple views x-ray of the left foot: Plate intact and in good position, appears to be decent fusion across the arthrodesis sites, no complication noted distally in the metatarsal heads Assessment:   1. 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  We discussed the etiology and treatment options of Morton's neuroma which her symptoms are most consistent with.  I was able to find her MRI report from July 2022 which showed a neuroma present and no evidence of plantar plate tearing.  We discussed treatment with padding and orthotics which she already has, I inspected these and they appear to be in good condition with a good metatarsal pad present.  We also  discussed injection therapy and surgical excision.  I recommended corticosteroid injection.  Following sterile prep with alcohol 2 mg of dexamethasone, 5 mg of Kenalog and 0.5 cc each of 2% lidocaine and 0.5% Marcaine plain was injected into the second interspace through a dorsal approach.  If there is any intermetatarsal bursitis I expect this likely will alleviate this as well.  I will see her back in 3 months for follow-up  Return in about 3 months (around 07/08/2022) for follow up on neuroma injection .

## 2022-05-27 ENCOUNTER — Ambulatory Visit: Payer: Medicare HMO | Admitting: Dermatology

## 2022-05-27 DIAGNOSIS — L910 Hypertrophic scar: Secondary | ICD-10-CM | POA: Diagnosis not present

## 2022-05-27 DIAGNOSIS — B351 Tinea unguium: Secondary | ICD-10-CM

## 2022-05-27 MED ORDER — FLUCONAZOLE 200 MG PO TABS: 200.0000 mg | ORAL_TABLET | Freq: Every day | ORAL | 0 refills | Status: DC

## 2022-05-27 NOTE — Patient Instructions (Addendum)
For toenails   Hold nail polish at this time.   Start fluconazole 200 mg tablet by mouth once weekly. Hold cholesterol medication the day you take this medication.   Side effects of fluconazole (diflucan) include nausea, diarrhea, headache, dizziness, taste changes, rare risk of irritation of the liver, allergy, or decreased blood counts (which could show up as infection or tiredness).   For Scar at left forehead  Apply twice daily to area   Also be good with Sunscreen    Recommend Serica moisturizing scar formula cream every night or [facility] brand or Mederma silicone scar sheet every night for the first year after a scar appears to help with scar remodeling if desired. Scars remodel on their own for a full year and will gradually improve in appearance over time.     Due to recent changes in healthcare laws, you may see results of your pathology and/or laboratory studies on MyChart before the doctors have had a chance to review them. We understand that in some cases there may be results that are confusing or concerning to you. Please understand that not all results are received at the same time and often the doctors may need to interpret multiple results in order to provide you with the best plan of care or course of treatment. Therefore, we ask that you please give Audrey Fletcher 2 business days to thoroughly review all your results before contacting the office for clarification. Should we see a critical lab result, you will be contacted sooner.   If You Need Anything After Your Visit  If you have any questions or concerns for your doctor, please call our main line at [phone] and press option 4 to reach your doctor's medical assistant. If no one answers, please leave a voicemail as directed and we will return your call as soon as possible. Messages left after 4 pm will be answered the following business day.   You may also send Audrey Fletcher a message via [place]. We typically respond to MyChart  messages within 1-2 business days.  For prescription refills, please ask your pharmacy to contact our office. Our fax number is [phone].  If you have an urgent issue when the clinic is closed that cannot wait until the next business day, you can page your doctor at the number below.    Please note that while we do our best to be available for urgent issues outside of office hours, we are not available 24/7.   If you have an urgent issue and are unable to reach Audrey Fletcher, you may choose to seek medical care at your doctor's office, retail clinic, urgent care center, or emergency room.  If you have a medical emergency, please immediately call 911 or go to the emergency department.  Pager Numbers  - Dr. Nehemiah Massed: [phone]  - Dr. Laurence Ferrari: [phone]  - Dr. Nicole Kindred: [phone]  In the event of inclement weather, please call our main line at [phone] for an update on the status of any delays or closures.  Dermatology Medication Tips: Please keep the boxes that topical medications come in in order to help keep track of the instructions about where and how to use these. Pharmacies typically print the medication instructions only on the boxes and not directly on the medication tubes.   If your medication is too expensive, please contact our office at [phone] option 4 or send Audrey Fletcher a message through [place].   We are unable to tell what your co-pay for medications will be in advance  as this is different depending on your insurance coverage. However, we may be able to find a substitute medication at lower cost or fill out paperwork to get insurance to cover a needed medication.   If a prior authorization is required to get your medication covered by your insurance company, please allow Audrey Fletcher 1-2 business days to complete this process.  Drug prices often vary depending on where the prescription is filled and some pharmacies may offer cheaper prices.  The website www.goodrx.com contains  coupons for medications through different pharmacies. The prices here do not account for what the cost may be with help from insurance (it may be cheaper with your insurance), but the website can give you the price if you did not use any insurance.  - You can print the associated coupon and take it with your prescription to the pharmacy.  - You may also stop by our office during regular business hours and pick up a GoodRx coupon card.  - If you need your prescription sent electronically to a different pharmacy, notify our office through [facility] or by phone at [phone] option 4.     Si Usted Necesita Algo Despus de Su Visita  Tambin puede enviarnos un mensaje a travs de Pharmacist, community. Por lo general respondemos a los mensajes de MyChart en el transcurso de 1 a 2 das hbiles.  Para renovar recetas, por favor pida a su farmacia que se ponga en contacto con nuestra oficina. Harland Dingwall de fax es [place] [phone].  Si tiene un asunto urgente cuando la clnica est cerrada y que no puede esperar hasta el siguiente da hbil, puede llamar/localizar a su doctor(a) al nmero que aparece a continuacin.   Por favor, tenga en cuenta que aunque hacemos todo lo posible para estar disponibles para asuntos urgentes fuera del horario de [place], no estamos disponibles las 24 horas del da, los 7 das de la [place].   Si tiene un problema urgente y no puede comunicarse con nosotros, puede optar por buscar atencin mdica  en el consultorio de su doctor(a), en una clnica privada, en un centro de atencin urgente o en una sala de emergencias.  Si tiene Engineering geologist, por favor llame inmediatamente al 911 o vaya a la sala de emergencias.  Nmeros de bper  - Dr. Nehemiah Massed: [phone]  - Dra. Moye: [phone]  - Dra. Nicole Kindred: [phone]  En caso de inclemencias del [place], por favor llame a Johnsie Kindred principal al [phone] para una actualizacin sobre el [place] de  cualquier retraso o cierre.  Consejos para la medicacin en dermatologa: Por favor, guarde las cajas en las que vienen los medicamentos de uso tpico para ayudarle a seguir las instrucciones sobre dnde y cmo usarlos. Las farmacias generalmente imprimen las instrucciones del medicamento slo en las cajas y no directamente en los tubos del [place].   Si su medicamento es muy caro, por favor, pngase en contacto con Zigmund Daniel llamando al [phone] y presione la opcin 4 o envenos un mensaje a travs de Pharmacist, community.   No podemos decirle cul ser su copago por los medicamentos por adelantado ya que esto es diferente dependiendo de la cobertura de su seguro. Sin embargo, es posible que podamos encontrar un medicamento sustituto a Electrical engineer un formulario para que el seguro cubra el medicamento que se considera necesario.   Si se requiere una autorizacin previa para que su compaa de seguros [place] su medicamento, por favor permtanos de 1 a 2 das  hbiles para completar este proceso.  Los precios de los medicamentos varan con frecuencia dependiendo del Environmental consultant de dnde se surte la receta y alguna farmacias pueden ofrecer precios ms baratos.  El sitio web www.goodrx.com tiene cupones para medicamentos de Airline pilot. Los precios aqu no tienen en cuenta lo que podra costar con la ayuda del seguro (puede ser ms barato con su seguro), pero el sitio web puede darle el precio si no utiliz Research scientist (physical sciences).  - Puede imprimir el cupn correspondiente y llevarlo con su receta a la farmacia.  - Tambin puede pasar por nuestra oficina durante el horario de atencin regular y Charity fundraiser una tarjeta de cupones de GoodRx.  - Si necesita que su receta se enve electrnicamente a una farmacia diferente, informe a nuestra oficina a travs de MyChart de [place] o por telfono llamando al [phone] y presione la opcin 4.

## 2022-05-27 NOTE — Progress Notes (Signed)
   Follow-Up Visit   Subjective  Audrey Fletcher is a 81 y.o. female who presents for the following: Follow-up (Patient states toenail at right great toenail is looking better since taking terbinafine 250 mg capsule but noticed other toenails are looking bad. Also reports suffering from a fall and would like to have scar at left forehead looked at. ).  Pt has had worsening of other toenails after going to salon and getting a pedicure.  Noticed later after removing the nail polish.   The following portions of the chart were reviewed this encounter and updated as appropriate:      Review of Systems: No other skin or systemic complaints except as noted in HPI or Assessment and Plan.   Objective  Well appearing patient in no apparent distress; mood and affect are within normal limits.  A focused examination was performed including toenails, face. Relevant physical exam findings are noted in the Assessment and Plan.  right great toe, right 2nd toe, and left great toe With white discoloration and onycholysis right great toe, left great toe, and 2nd right toe.  Some improvement of R gt toenail when compared to baseline photo.           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 Condition is symptomatic/ bothersome to patient. Not currently at goal.    D/c terbinafine 250 mg tab, since has finished 3 month course and has had worsening.  Avoid applying nail polish   Start fluconazole 200 mg tablet by mouth weekly (will be written daily, but she was instructed to take weekly). Hold cholesterol medication on day taking fluconazole   Side effects of fluconazole (diflucan) include nausea, diarrhea, headache, dizziness, taste changes, rare risk of irritation of the liver, allergy, or decreased blood  counts (which could show up as infection or tiredness).   fluconazole (DIFLUCAN) 200 MG tablet - right great toe, right 2nd toe, and left great toe Take 1 tablet (200 mg total) by mouth daily. Take as directed in patient handout  Hypertrophic scar left upper brow  Benign, observe.    Recommend Serica moisturizing scar formula cream twice daily for the first year after a scar appears to help with scar remodeling if desired. Scars remodel on their own for a full year and will gradually improve in appearance over time.   Recommend sunscreen daily to affected areas daily.     Return for 3 - 4 month follow up on toenails. I, Ruthell Rummage, CMA, am acting as scribe for Brendolyn Patty, MD.  Documentation: I have reviewed the above documentation for accuracy and completeness, and I agree with the above.  Brendolyn Patty MD

## 2022-07-09 ENCOUNTER — Ambulatory Visit: Payer: Medicare HMO | Admitting: Podiatry

## 2022-07-09 DIAGNOSIS — G5762 Lesion of plantar nerve, left lower limb: Secondary | ICD-10-CM

## 2022-07-13 NOTE — Progress Notes (Signed)
  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 female presents with the above complaint. History confirmed with patient.  Doing much better  Objective:  Physical Exam: warm, good capillary refill, no trophic changes or ulcerative lesions, normal DP and PT pulses, normal sensory exam, and well-healed surgical scar in the midfoot.  No pain today in forefoot to palpation   Radiographs: Multiple views x-ray of the left foot: Plate intact and in good position, appears to be decent fusion across the arthrodesis sites, no complication noted distally in the metatarsal heads Assessment:   No diagnosis found.    Plan:  Patient was evaluated and treated and all questions answered.  Doing much better we discussed continuing  non surgical treatment and monitoring the neuroma for any worsening, she will return PRN for further injections  No follow-ups on file.

## 2022-09-01 ENCOUNTER — Ambulatory Visit: Payer: Medicare HMO | Admitting: Dermatology

## 2022-09-02 ENCOUNTER — Ambulatory Visit: Payer: Medicare HMO | Admitting: Dermatology

## 2022-09-02 VITALS — BP 122/56

## 2022-09-02 DIAGNOSIS — B351 Tinea unguium: Secondary | ICD-10-CM

## 2022-09-02 NOTE — Progress Notes (Signed)
   Follow-Up Visit   Subjective  Audrey Fletcher is a 81 y.o. female who presents for the following: Follow-up.  Patient presents for 3 month follow-up onychomycosis of the bilateral great toenails. She had no improvement with terbinafine in the past, but has had some improvement with fluconazole 200 MG every week. She also has a few brown spots on the face she wants checked.   The following portions of the chart were reviewed this encounter and updated as appropriate:       Review of Systems:  No other skin or systemic complaints except as noted in HPI or Assessment and Plan.  Objective  Well appearing patient in no apparent distress; mood and affect are within normal limits.  A focused examination was performed including toenails. Relevant physical exam findings are noted in the Assessment and Plan.  toenails Toenails with decreased distal white thickening/onycholysis when compared to previous photos. New photos today         Assessment & Plan  Seborrheic Keratoses - Stuck-on, waxy, tan-brown patches face - Benign-appearing - Discussed benign etiology and prognosis. - Observe, can try CeraVe SA cream to keep smooth, and spf daily to prevent darkening with sun exposure - Call for any changes  Onychomycosis toenails  Chronic and persistent condition with duration or expected duration over one year. Condition is symptomatic / bothersome to patient. Not to goal, but improving.  LFTs reviewed from 03/29/22, wnl  Continue fluconazole 200 mg tablet by mouth weekly ~4 more months.  Will evaluate at that time if need to continue treatment. Hold cholesterol medication on day taking fluconazole    Side effects of fluconazole (diflucan) include nausea, diarrhea, headache, dizziness, taste changes, rare risk of irritation of the liver, allergy, or decreased blood counts (which could show up as infection or tiredness).    Related Medications fluconazole (DIFLUCAN) 200 MG  tablet Take 1 tablet (200 mg total) by mouth daily. Take as directed in patient handout   Return in about 4 months (around 01/02/2023) for onychomycosis.  IJamesetta Orleans, CMA, am acting as scribe for Brendolyn Patty, MD .  Documentation: I have reviewed the above documentation for accuracy and completeness, and I agree with the above.  Brendolyn Patty MD

## 2022-09-02 NOTE — Patient Instructions (Addendum)
Continue fluconazole 200 mg tablet by mouth weekly. Hold cholesterol medication on day taking fluconazole    Side effects of fluconazole (diflucan) include nausea, diarrhea, headache, dizziness, taste changes, rare risk of irritation of the liver, allergy, or decreased blood counts (which could show up as infection or tiredness).     Seborrheic Keratosis  What causes seborrheic keratoses? Seborrheic keratoses are harmless, common skin growths that first appear during adult life.  As time goes by, more growths appear.  Some people may develop a large number of them.  Seborrheic keratoses appear on both covered and uncovered body parts.  They are not caused by sunlight.  The tendency to develop seborrheic keratoses can be inherited.  They vary in color from skin-colored to gray, brown, or even black.  They can be either smooth or have a rough, warty surface.   Seborrheic keratoses are superficial and look as if they were stuck on the skin.  Under the microscope this type of keratosis looks like layers upon layers of skin.  That is why at times the top layer may seem to fall off, but the rest of the growth remains and re-grows.    Treatment Seborrheic keratoses do not need to be treated, but can easily be removed in the office.  Seborrheic keratoses often cause symptoms when they rub on clothing or jewelry.  Lesions can be in the way of shaving.  If they become inflamed, they can cause itching, soreness, or burning.  Removal of a seborrheic keratosis can be accomplished by freezing, burning, or surgery. If any spot bleeds, scabs, or grows rapidly, please return to have it checked, as these can be an indication of a skin cancer.  Due to recent changes in healthcare laws, you may see results of your pathology and/or laboratory studies on MyChart before the doctors have had a chance to review them. We understand that in some cases there may be results that are confusing or concerning to you. Please  understand that not all results are received at the same time and often the doctors may need to interpret multiple results in order to provide you with the best plan of care or course of treatment. Therefore, we ask that you please give [place] 2 business days to thoroughly review all your results before contacting the office for clarification. Should we see a critical lab result, you will be contacted sooner.   If You Need Anything After Your Visit  If you have any questions or concerns for your doctor, please call our main line at [phone] and press option 4 to reach your doctor's medical assistant. If no one answers, please leave a voicemail as directed and we will return your call as soon as possible. Messages left after 4 pm will be answered the following business day.   You may also send [place] a message via [place]. We typically respond to MyChart messages within 1-2 business days.  For prescription refills, please ask your pharmacy to contact our office. Our fax number is [phone].  If you have an urgent issue when the clinic is closed that cannot wait until the next business day, you can page your doctor at the number below.    Please note that while we do our best to be available for urgent issues outside of office hours, we are not available 24/7.   If you have an urgent issue and are unable to reach [place], you may choose to seek medical care at your doctor's office, retail clinic,  urgent care center, or emergency room.  If you have a medical emergency, please immediately call 911 or go to the emergency department.  Pager Numbers  - Dr. Nehemiah Massed: [phone]  - Dr. Laurence Ferrari: [phone]  - Dr. Nicole Kindred: [phone]  In the event of inclement weather, please call our main line at [phone] for an update on the status of any delays or closures.  Dermatology Medication Tips: Please keep the boxes that topical medications come in in order to help keep track of the instructions about  where and how to use these. Pharmacies typically print the medication instructions only on the boxes and not directly on the medication tubes.   If your medication is too expensive, please contact our office at [phone] option 4 or send [place] a message through [place].   We are unable to tell what your co-pay for medications will be in advance as this is different depending on your insurance coverage. However, we may be able to find a substitute medication at lower cost or fill out paperwork to get insurance to cover a needed medication.   If a prior authorization is required to get your medication covered by your insurance company, please allow [place] 1-2 business days to complete this process.  Drug prices often vary depending on where the prescription is filled and some pharmacies may offer cheaper prices.  The website www.goodrx.com contains coupons for medications through different pharmacies. The prices here do not account for what the cost may be with help from insurance (it may be cheaper with your insurance), but the website can give you the price if you did not use any insurance.  - You can print the associated coupon and take it with your prescription to the pharmacy.  - You may also stop by our office during regular business hours and pick up a GoodRx coupon card.  - If you need your prescription sent electronically to a different pharmacy, notify our office through [facility] or by phone at [phone] option 4.     Si Usted Necesita Algo Despus de Su Visita  Tambin puede enviarnos un mensaje a travs de Pharmacist, community. Por lo general respondemos a los mensajes de MyChart en el transcurso de 1 a 2 das hbiles.  Para renovar recetas, por favor pida a su farmacia que se ponga en contacto con nuestra oficina. Harland Dingwall de fax es [place] [phone].  Si tiene un asunto urgente cuando la clnica est cerrada y que no puede esperar hasta el siguiente da hbil, puede  llamar/localizar a su doctor(a) al nmero que aparece a continuacin.   Por favor, tenga en cuenta que aunque hacemos todo lo posible para estar disponibles para asuntos urgentes fuera del horario de [place], no estamos disponibles las 24 horas del da, los 7 das de la [place].   Si tiene un problema urgente y no puede comunicarse con nosotros, puede optar por buscar atencin mdica  en el consultorio de su doctor(a), en una clnica privada, en un centro de atencin urgente o en una sala de emergencias.  Si tiene Engineering geologist, por favor llame inmediatamente al 911 o vaya a la sala de emergencias.  Nmeros de bper  - Dr. Nehemiah Massed: [phone]  - Dra. Moye: [phone]  - Dra. Nicole Kindred: [phone]  En caso de inclemencias del [place], por favor llame a Johnsie Kindred principal al [phone] para una actualizacin sobre el [place] de cualquier retraso o cierre.  Consejos para la medicacin en dermatologa: Por favor, guarde las cajas  en las que vienen los medicamentos de uso tpico para ayudarle a seguir las instrucciones sobre dnde y cmo usarlos. Las farmacias generalmente imprimen las instrucciones del medicamento slo en las cajas y no directamente en los tubos del [place].   Si su medicamento es muy caro, por favor, pngase en contacto con Zigmund Daniel llamando al [phone] y presione la opcin 4 o envenos un mensaje a travs de Pharmacist, community.   No podemos decirle cul ser su copago por los medicamentos por adelantado ya que esto es diferente dependiendo de la cobertura de su seguro. Sin embargo, es posible que podamos encontrar un medicamento sustituto a Electrical engineer un formulario para que el seguro cubra el medicamento que se considera necesario.   Si se requiere una autorizacin previa para que su compaa de seguros [place] su medicamento, por favor permtanos de 1 a 2 das hbiles para completar este proceso.  Los precios de los medicamentos varan con  frecuencia dependiendo del Environmental consultant de dnde se surte la receta y alguna farmacias pueden ofrecer precios ms baratos.  El sitio web www.goodrx.com tiene cupones para medicamentos de Airline pilot. Los precios aqu no tienen en cuenta lo que podra costar con la ayuda del seguro (puede ser ms barato con su seguro), pero el sitio web puede darle el precio si no utiliz Research scientist (physical sciences).  - Puede imprimir el cupn correspondiente y llevarlo con su receta a la farmacia.  - Tambin puede pasar por nuestra oficina durante el horario de atencin regular y Charity fundraiser una tarjeta de cupones de GoodRx.  - Si necesita que su receta se enve electrnicamente a una farmacia diferente, informe a nuestra oficina a travs de MyChart de [place] o por telfono llamando al [phone] y presione la opcin 4.

## 2022-10-14 ENCOUNTER — Ambulatory Visit: Payer: Medicare HMO | Admitting: Dermatology

## 2023-01-06 ENCOUNTER — Ambulatory Visit: Payer: Medicare HMO | Admitting: Dermatology

## 2023-01-06 VITALS — BP 128/67 | HR 75

## 2023-01-06 DIAGNOSIS — B351 Tinea unguium: Secondary | ICD-10-CM | POA: Diagnosis not present

## 2023-01-06 MED ORDER — FLUCONAZOLE 200 MG PO TABS: 200.0000 mg | ORAL_TABLET | Freq: Every day | ORAL | 0 refills | Status: DC

## 2023-01-06 NOTE — Patient Instructions (Addendum)
Continue fluconazole 200 mg tablet by mouth weekly  Hold cholesterol medication on day taking fluconazole    Side effects of fluconazole (diflucan) include nausea, diarrhea, headache, dizziness, taste changes, rare risk of irritation of the liver, allergy, or decreased blood counts (which could show up as infection or tiredness).  Due to recent changes in healthcare laws, you may see results of your pathology and/or laboratory studies on MyChart before the doctors have had a chance to review them. We understand that in some cases there may be results that are confusing or concerning to you. Please understand that not all results are received at the same time and often the doctors may need to interpret multiple results in order to provide you with the best plan of care or course of treatment. Therefore, we ask that you please give [place] 2 business days to thoroughly review all your results before contacting the office for clarification. Should we see a critical lab result, you will be contacted sooner.   If You Need Anything After Your Visit  If you have any questions or concerns for your doctor, please call our main line at [phone] and press option 4 to reach your doctor's medical assistant. If no one answers, please leave a voicemail as directed and we will return your call as soon as possible. Messages left after 4 pm will be answered the following business day.   You may also send [place] a message via MyChart. We typically respond to MyChart messages within 1-2 business days.  For prescription refills, please ask your pharmacy to contact our office. Our fax number is [phone].  If you have an urgent issue when the clinic is closed that cannot wait until the next business day, you can page your doctor at the number below.    Please note that while we do our best to be available for urgent issues outside of office hours, we are not available 24/7.   If you have an urgent issue and are unable  to reach [place], you may choose to seek medical care at your doctor's office, retail clinic, urgent care center, or emergency room.  If you have a medical emergency, please immediately call 911 or go to the emergency department.  Pager Numbers  - Dr. Gwen Pounds: [phone]  - Dr. Neale Burly: [phone]  - Dr. Roseanne Reno: [phone]  In the event of inclement weather, please call our main line at [phone] for an update on the status of any delays or closures.  Dermatology Medication Tips: Please keep the boxes that topical medications come in in order to help keep track of the instructions about where and how to use these. Pharmacies typically print the medication instructions only on the boxes and not directly on the medication tubes.   If your medication is too expensive, please contact our office at [phone] option 4 or send [place] a message through MyChart.   We are unable to tell what your co-pay for medications will be in advance as this is different depending on your insurance coverage. However, we may be able to find a substitute medication at lower cost or fill out paperwork to get insurance to cover a needed medication.   If a prior authorization is required to get your medication covered by your insurance company, please allow [place] 1-2 business days to complete this process.  Drug prices often vary depending on where the prescription is filled and some pharmacies may offer cheaper prices.  The website www.goodrx.com contains coupons for medications through  different pharmacies. The prices here do not account for what the cost may be with help from insurance (it may be cheaper with your insurance), but the website can give you the price if you did not use any insurance.  - You can print the associated coupon and take it with your prescription to the pharmacy.  - You may also stop by our office during regular business hours and pick up a GoodRx coupon card.  - If you need your  prescription sent electronically to a different pharmacy, notify our office through [facility] or by phone at [phone] option 4.     Si Usted Necesita Algo Despus de Su Visita  Tambin puede enviarnos un mensaje a travs de Clinical cytogeneticist. Por lo general respondemos a los mensajes de MyChart en el transcurso de 1 a 2 das hbiles.  Para renovar recetas, por favor pida a su farmacia que se ponga en contacto con nuestra oficina. Annie Sable de fax es [place] [phone].  Si tiene un asunto urgente cuando la clnica est cerrada y que no puede esperar hasta el siguiente da hbil, puede llamar/localizar a su doctor(a) al nmero que aparece a continuacin.   Por favor, tenga en cuenta que aunque hacemos todo lo posible para estar disponibles para asuntos urgentes fuera del horario de [place], no estamos disponibles las 24 horas del da, los 7 [address]  [address] de la [place].   Si tiene un problema urgente y no puede comunicarse con nosotros, puede optar por buscar atencin mdica  en el consultorio de su doctor(a), en una clnica privada, en un centro de atencin urgente o en una sala de emergencias.  Si tiene Engineer, drilling, por favor llame inmediatamente al 911 o vaya a la sala de emergencias.  Nmeros de bper  - Dr. Gwen Pounds: [phone]  - Dra. Moye: [phone]  - Dra. Roseanne Reno: [phone]  En caso de inclemencias del [place], por favor llame a Lacy Duverney principal al [phone] para una actualizacin sobre el [place] de cualquier retraso o cierre.  Consejos para la medicacin en dermatologa: Por favor, guarde las cajas en las que vienen los medicamentos de uso tpico para ayudarle a seguir las instrucciones sobre dnde y cmo usarlos. Las farmacias generalmente imprimen las instrucciones del medicamento slo en las cajas y no directamente en los tubos del [place].   Si su medicamento es muy caro, por favor, pngase en contacto con Rolm Gala llamando al [phone]  y presione la opcin 4 o envenos un mensaje a travs de Clinical cytogeneticist.   No podemos decirle cul ser su copago por los medicamentos por adelantado ya que esto es diferente dependiendo de la cobertura de su seguro. Sin embargo, es posible que podamos encontrar un medicamento sustituto a Audiological scientist un formulario para que el seguro cubra el medicamento que se considera necesario.   Si se requiere una autorizacin previa para que su compaa de seguros [place] su medicamento, por favor permtanos de 1 a 2 das hbiles para completar [address]  Los precios de los medicamentos varan con frecuencia dependiendo del Environmental consultant de dnde se surte la receta y alguna farmacias pueden ofrecer precios ms baratos.  El sitio web www.goodrx.com tiene cupones para medicamentos de Health and safety inspector. Los precios aqu no tienen en cuenta lo que podra costar con la ayuda del seguro (puede ser ms barato con su seguro), pero el sitio web puede darle el precio si no utiliz Tourist information centre manager.  - Puede imprimir el cupn correspondiente y llevarlo con  con su receta a la farmacia.  - Tambin puede pasar por nuestra oficina durante el horario de atencin regular y recoger una tarjeta de cupones de GoodRx.  - Si necesita que su receta se enve electrnicamente a una farmacia diferente, informe a nuestra oficina a travs de MyChart de [place] o por telfono llamando al [phone] y presione la opcin 4.  

## 2023-01-06 NOTE — Progress Notes (Signed)
   Follow-Up Visit   Subjective  Audrey Fletcher is a 82 y.o. female who presents for the following: Onychomycosis 4 month follow-up. Patient finished fluconazole 200 mg last week (7 months total). Toenails on the left foot have improved, but not on the right.   The following portions of the chart were reviewed this encounter and updated as appropriate: medications, allergies, medical history  Review of Systems:  No other skin or systemic complaints except as noted in HPI or Assessment and Plan.  Objective  Well appearing patient in no apparent distress; mood and affect are within normal limits.  A focused examination was performed of the following areas: Face, toenails  Relevant exam findings are noted in the Assessment and Plan.  L and R great toenails L gt toenail clear. R great toenail distal yellow/white discoloration with thickening. Improved when compared to baseline photos.     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 Condition is symptomatic/ bothersome to patient. Not currently at goal.   CBC, LFT reviewed from 10/01/2022 wnl  Continue fluconazole 200 MG take 1 po qwk dsp #15 0Rf.  Hold cholesterol medication on day taking fluconazole    Side effects of fluconazole (diflucan) include nausea, diarrhea, headache, dizziness, taste changes, rare risk of irritation of the liver, allergy, or decreased blood counts (which could show up as infection or tiredness).  fluconazole (DIFLUCAN) 200 MG tablet - L and R great toenails Take 1 tablet (200 mg total) by mouth daily. Take as directed in patient handout    Return in about 3 months (around 04/07/2023), or 3-4 months, for onychomycosis.  ICherlyn Labella, CMA, am acting as scribe for Willeen Niece, MD .  Documentation: I have reviewed the above documentation for accuracy and completeness, and I agree with the  above.  Willeen Niece, MD

## 2023-04-02 ENCOUNTER — Ambulatory Visit: Payer: Medicare HMO | Admitting: Dermatology

## 2023-04-02 DIAGNOSIS — B351 Tinea unguium: Secondary | ICD-10-CM

## 2023-04-02 MED ORDER — TAVABOROLE 5 % EX SOLN: CUTANEOUS | 11 refills | Status: DC

## 2023-04-02 MED ORDER — FLUCONAZOLE 200 MG PO TABS
200.0000 mg | ORAL_TABLET | Freq: Every day | ORAL | 0 refills | Status: DC
Start: 2023-04-02 — End: 2024-05-05

## 2023-04-02 NOTE — Patient Instructions (Signed)

## 2023-04-02 NOTE — Progress Notes (Signed)
   Follow-Up Visit   Subjective  Audrey Fletcher is a 82 y.o. female who presents for the following: onychomycosis - patient has noticed an improvement in nails, and states they are no longer yellow. She has three more Fluconazole pills left. She started it last September 2023.  Not using any topical treatment.   The following portions of the chart were reviewed this encounter and updated as appropriate: medications, allergies, medical history  Review of Systems:  No other skin or systemic complaints except as noted in HPI or Assessment and Plan.  Objective  Well appearing patient in no apparent distress; mood and affect are within normal limits.   A focused examination was performed of the following areas: the feet and toenails   Relevant exam findings are noted in the Assessment and Plan.    Assessment & Plan   ONYCHOMYCOSIS Exam: L great toenail clear, R great toenail with distal white onycholysis. Improvement noted when compared to photo.  Chronic and persistent condition with duration or expected duration over one year. Condition is symptomatic/ bothersome to patient. Not currently at goal but improving.  Treatment Plan: Continue Fluconazole 200 mg po QW for two more months. #6 0RF. Pt has 4 pills left at home.  Start Kerydin solution to nails QHS. GoodRx coupon printed for patient to take with her to [place].   Side effects of fluconazole (diflucan) include nausea, diarrhea, headache, dizziness, taste changes, rare risk of irritation of the liver, allergy, or decreased blood counts (which could show up as infection or tiredness).  Hold cholesterol medication on day taking fluconazole   Return if symptoms worsen or fail to improve.  Maylene Roes, CMA, am acting as scribe for Willeen Niece, MD .  Documentation: I have reviewed the above documentation for accuracy and completeness, and I agree with the above.  Willeen Niece, MD

## 2023-04-07 ENCOUNTER — Ambulatory Visit: Payer: Medicare HMO | Admitting: Dermatology

## 2023-05-22 ENCOUNTER — Other Ambulatory Visit: Payer: Self-pay | Admitting: Oncology

## 2023-05-22 DIAGNOSIS — Z006 Encounter for examination for normal comparison and control in clinical research program: Secondary | ICD-10-CM

## 2023-08-07 ENCOUNTER — Other Ambulatory Visit: Payer: Medicare HMO | Attending: Oncology

## 2023-08-26 ENCOUNTER — Other Ambulatory Visit
Admission: RE | Admit: 2023-08-26 | Discharge: 2023-08-26 | Disposition: A | Discharge status: Home / Self Care | Payer: Medicare HMO | Source: Ambulatory Visit | Attending: Oncology | Admitting: Oncology

## 2023-08-26 DIAGNOSIS — Z006 Encounter for examination for normal comparison and control in clinical research program: Secondary | ICD-10-CM | POA: Insufficient documentation

## 2023-09-05 LAB — GENECONNECT MOLECULAR SCREEN: Genetic Analysis Overall Interpretation: NEGATIVE

## 2024-04-01 ENCOUNTER — Encounter: Payer: Self-pay | Admitting: Advanced Practice Midwife

## 2024-05-05 ENCOUNTER — Ambulatory Visit: Admitting: Dermatology

## 2024-05-05 ENCOUNTER — Encounter: Payer: Self-pay | Admitting: Dermatology

## 2024-05-05 DIAGNOSIS — D492 Neoplasm of unspecified behavior of bone, soft tissue, and skin: Secondary | ICD-10-CM | POA: Diagnosis not present

## 2024-05-05 DIAGNOSIS — Z1283 Encounter for screening for malignant neoplasm of skin: Secondary | ICD-10-CM | POA: Diagnosis not present

## 2024-05-05 DIAGNOSIS — D229 Melanocytic nevi, unspecified: Secondary | ICD-10-CM

## 2024-05-05 DIAGNOSIS — W908XXA Exposure to other nonionizing radiation, initial encounter: Secondary | ICD-10-CM | POA: Diagnosis not present

## 2024-05-05 DIAGNOSIS — D1801 Hemangioma of skin and subcutaneous tissue: Secondary | ICD-10-CM

## 2024-05-05 DIAGNOSIS — L820 Inflamed seborrheic keratosis: Secondary | ICD-10-CM

## 2024-05-05 DIAGNOSIS — L570 Actinic keratosis: Secondary | ICD-10-CM | POA: Diagnosis not present

## 2024-05-05 DIAGNOSIS — L814 Other melanin hyperpigmentation: Secondary | ICD-10-CM

## 2024-05-05 DIAGNOSIS — L821 Other seborrheic keratosis: Secondary | ICD-10-CM

## 2024-05-05 DIAGNOSIS — L219 Seborrheic dermatitis, unspecified: Secondary | ICD-10-CM | POA: Diagnosis not present

## 2024-05-05 DIAGNOSIS — L578 Other skin changes due to chronic exposure to nonionizing radiation: Secondary | ICD-10-CM

## 2024-05-05 MED ORDER — KETOCONAZOLE 2 % EX CREA: TOPICAL_CREAM | CUTANEOUS | 5 refills | Status: AC

## 2024-05-05 NOTE — Patient Instructions (Addendum)
 Cryotherapy Aftercare  Wash gently with soap and water everyday.   Apply Vaseline Jelly daily until healed.     Wound Care Instructions  Cleanse wound gently with soap and water once a day then pat dry with clean gauze. Apply a thin coat of Petrolatum (petroleum jelly, Vaseline) over the wound (unless you have an allergy to this). We recommend that you use a new, sterile tube of Vaseline. Do not pick or remove scabs. Do not remove the yellow or white healing tissue from the base of the wound.  Cover the wound with fresh, clean, nonstick gauze and secure with paper tape. You may use Band-Aids in place of gauze and tape if the wound is small enough, but would recommend trimming much of the tape off as there is often too much. Sometimes Band-Aids can irritate the skin.  You should call the office for your biopsy report after 1 week if you have not already been contacted.  If you experience any problems, such as abnormal amounts of bleeding, swelling, significant bruising, significant pain, or evidence of infection, please call the office immediately.  FOR ADULT SURGERY PATIENTS: If you need something for pain relief you may take 1 extra strength Tylenol  (acetaminophen ) AND 2 Ibuprofen (200mg  each) together every 4 hours as needed for pain. (do not take these if you are allergic to them or if you have a reason you should not take them.) Typically, you may only need pain medication for 1 to 3 days.    Continue Dove shampoo and conditioner for itchy scalp.   Apply ketoconazole  cream once to twice a day as needed for itching/scaling in ears, nasolabial folds and creases of nose.   Recommend daily broad spectrum sunscreen SPF 30+ to sun-exposed areas, reapply every 2 hours as needed. Call for new or changing lesions.  Staying in the shade or wearing long sleeves, sun glasses (UVA+UVB protection) and wide brim hats (4-inch brim around the entire circumference of the hat) are also recommended for  sun protection.      Melanoma ABCDEs  Melanoma is the most dangerous type of skin cancer, and is the leading cause of death from skin disease.  You are more likely to develop melanoma if you: Have light-colored skin, light-colored eyes, or red or blond hair Spend a lot of time in the sun Tan regularly, either outdoors or in a tanning bed Have had blistering sunburns, especially during childhood Have a close family member who has had a melanoma Have atypical moles or large birthmarks  Early detection of melanoma is key since treatment is typically straightforward and cure rates are extremely high if we catch it early.   The first sign of melanoma is often a change in a mole or a new dark spot.  The ABCDE system is a way of remembering the signs of melanoma.  A for asymmetry:  The two halves do not match. B for border:  The edges of the growth are irregular. C for color:  A mixture of colors are present instead of an even brown color. D for diameter:  Melanomas are usually (but not always) greater than 6mm - the size of a pencil eraser. E for evolution:  The spot keeps changing in size, shape, and color.  Please check your skin once per month between visits. You can use a small mirror in front and a large mirror behind you to keep an eye on the back side or your body.   If you see any  new or changing lesions before your next follow-up, please call to schedule a visit.  Please continue daily skin protection including broad spectrum sunscreen SPF 30+ to sun-exposed areas, reapplying every 2 hours as needed when you're outdoors.   Staying in the shade or wearing long sleeves, sun glasses (UVA+UVB protection) and wide brim hats (4-inch brim around the entire circumference of the hat) are also recommended for sun protection.      Due to recent changes in healthcare laws, you may see results of your pathology and/or laboratory studies on MyChart before the doctors have had a chance to  review them. We understand that in some cases there may be results that are confusing or concerning to you. Please understand that not all results are received at the same time and often the doctors may need to interpret multiple results in order to provide you with the best plan of care or course of treatment. Therefore, we ask that you please give us  2 business days to thoroughly review all your results before contacting the office for clarification. Should we see a critical lab result, you will be contacted sooner.   If You Need Anything After Your Visit  If you have any questions or concerns for your doctor, please call our main line at [phone] and press option 4 to reach your doctor's medical assistant. If no one answers, please leave a voicemail as directed and we will return your call as soon as possible. Messages left after 4 pm will be answered the following business day.   You may also send us  a message via MyChart. We typically respond to MyChart messages within 1-2 business days.  For prescription refills, please ask your pharmacy to contact our office. Our fax number is [phone].  If you have an urgent issue when the clinic is closed that cannot wait until the next business day, you can page your doctor at the number below.    Please note that while we do our best to be available for urgent issues outside of office hours, we are not available 24/7.   If you have an urgent issue and are unable to reach us , you may choose to seek medical care at your doctor's office, retail clinic, urgent care center, or emergency room.  If you have a medical emergency, please immediately call 911 or go to the emergency department.  Pager Numbers  - Dr. Hester: [phone]  - Dr. Jackquline: [phone]  - Dr. Claudene: [phone]   - Dr. Raymund: [phone]  In the event of inclement weather, please call our main line at [phone] for an update on the status of any delays or  closures.  Dermatology Medication Tips: Please keep the boxes that topical medications come in in order to help keep track of the instructions about where and how to use these. Pharmacies typically print the medication instructions only on the boxes and not directly on the medication tubes.   If your medication is too expensive, please contact our office at [phone] option 4 or send us  a message through MyChart.   We are unable to tell what your co-pay for medications will be in advance as this is different depending on your insurance coverage. However, we may be able to find a substitute medication at lower cost or fill out paperwork to get insurance to cover a needed medication.   If a prior authorization is required to get your medication covered by your insurance company, please allow us  1-2 business days to  complete this process.  Drug prices often vary depending on where the prescription is filled and some pharmacies may offer cheaper prices.  The website www.goodrx.com contains coupons for medications through different pharmacies. The prices here do not account for what the cost may be with help from insurance (it may be cheaper with your insurance), but the website can give you the price if you did not use any insurance.  - You can print the associated coupon and take it with your prescription to the pharmacy.  - You may also stop by our office during regular business hours and pick up a GoodRx coupon card.  - If you need your prescription sent electronically to a different pharmacy, notify our office through [facility] or by phone at [phone] option 4.     Si Usted Necesita Algo Despus de Su Visita  Tambin puede enviarnos un mensaje a travs de Clinical cytogeneticist. Por lo general respondemos a los mensajes de MyChart en el transcurso de 1 a 2 das hbiles.  Para renovar recetas, por favor pida a su farmacia que se ponga en contacto con nuestra oficina. Randi lakes de fax  es [place] [phone].  Si tiene un asunto urgente cuando la clnica est cerrada y que no puede esperar hasta el siguiente da hbil, puede llamar/localizar a su doctor(a) al nmero que aparece a continuacin.   Por favor, tenga en cuenta que aunque hacemos todo lo posible para estar disponibles para asuntos urgentes fuera del horario de [place], no estamos disponibles las 24 horas del da, los 7 [address]  [address] de la [place].   Si tiene un problema urgente y no puede comunicarse con nosotros, puede optar por buscar atencin mdica  en el consultorio de su doctor(a), en una clnica privada, en un centro de atencin urgente o en una sala de emergencias.  Si tiene Engineer, drilling, por favor llame inmediatamente al 911 o vaya a la sala de emergencias.  Nmeros de bper  - Dr. Hester: [phone]  - Dra. Jackquline: [phone]  - Dr. Claudene: [phone]  - Dra. Kitts: [phone]  En caso de inclemencias del [place], por favor llame a nuestra lnea principal al [phone] para una actualizacin sobre el estado de cualquier retraso o cierre.  Consejos para la medicacin en dermatologa: Por favor, guarde las cajas en las que vienen los medicamentos de uso tpico para ayudarle a seguir las instrucciones sobre dnde y cmo usarlos. Las farmacias generalmente imprimen las instrucciones del medicamento slo en las cajas y no directamente en los tubos del [place].   Si su medicamento es muy caro, por favor, pngase en contacto con landry rieger llamando al [phone] y presione la opcin 4 o envenos un mensaje a travs de Clinical cytogeneticist.   No podemos decirle cul ser su copago por los medicamentos por adelantado ya que esto es diferente dependiendo de la cobertura de su seguro. Sin embargo, es posible que podamos encontrar un medicamento sustituto a Audiological scientist un formulario para que el seguro cubra el medicamento que se considera necesario.   Si se requiere una autorizacin previa para  que su compaa de seguros malta su medicamento, por favor permtanos de 1 a 2 das hbiles para completar este proceso.  Los precios de los medicamentos varan con frecuencia dependiendo del Environmental consultant de dnde se surte la receta y alguna farmacias pueden ofrecer precios ms baratos.  El sitio web www.goodrx.com tiene cupones para medicamentos de Health and safety inspector. Los precios aqu no tienen en cuenta lo que podra  costar con la ayuda del seguro (puede ser ms barato con su seguro), pero el sitio web puede darle el precio si no Visual merchandiser.  - Puede imprimir el cupn correspondiente y llevarlo con su receta a la farmacia.  - Tambin puede pasar por nuestra oficina durante el horario de atencin regular y Education officer, museum una tarjeta de cupones de GoodRx.  - Si necesita que su receta se enve electrnicamente a una farmacia diferente, informe a nuestra oficina a travs de MyChart de [place] o por telfono llamando al [phone] y presione la opcin 4.

## 2024-05-05 NOTE — Progress Notes (Signed)
 Follow-Up Visit   Subjective  Audrey Fletcher is a 83 y.o. female who presents for the following: Skin Cancer Screening and Full Body Skin Exam. 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 Mohs in [place].   Spot of concern on nose. Bleeds easily. Recurs.   C/O itching on scalp and inside ears. Dur: off and on for years.   The patient presents for Total-Body Skin Exam (TBSE) for skin cancer screening and mole check. The patient has spots, moles and lesions to be evaluated, some may be new or changing and the patient may have concern these could be cancer.    The following portions of the chart were reviewed this encounter and updated as appropriate: medications, allergies, medical history  Review of Systems:  No other skin or systemic complaints except as noted in HPI or Assessment and Plan.  Objective  Well appearing patient in no apparent distress; mood and affect are within normal limits.  A full examination was performed including scalp, head, eyes, ears, nose, lips, neck, chest, axillae, abdomen, back, buttocks, bilateral upper extremities, bilateral lower extremities, hands, feet, fingers, toes, fingernails, and toenails. All findings within normal limits unless otherwise noted below.   Relevant physical exam findings are noted in the Assessment and Plan.  Left Temple x1 Erythematous keratotic or waxy stuck-on papule or plaque. Left Alar Crease 2.5 mm pink papule with telangiectasias   Left Dorsal Hand x1 Erythematous thin papules/macules with gritty scale.   Assessment & Plan   SKIN CANCER SCREENING PERFORMED TODAY.  ACTINIC DAMAGE - Chronic condition, secondary to cumulative UV/sun exposure - diffuse scaly erythematous macules with underlying dyspigmentation - Recommend daily broad spectrum sunscreen SPF 30+ to sun-exposed areas, reapply every 2 hours as needed.  - Staying in the shade or wearing long sleeves, sun glasses (UVA+UVB protection) and wide brim hats (4-inch brim around the entire  circumference of the hat) are also recommended for sun protection.  - Call for new or changing lesions.  LENTIGINES, SEBORRHEIC KERATOSES, HEMANGIOMAS - Benign normal skin lesions - Benign-appearing - Call for any changes  MELANOCYTIC NEVI - Tan-brown and/or pink-flesh-colored symmetric macules and papules - Benign appearing on exam today - Observation - Call clinic for new or changing moles - Recommend daily use of broad spectrum spf 30+ sunscreen to sun-exposed areas.   SEBORRHEIC DERMATITIS Exam: pruritus in ears but no focal lesions. Pink papules in R nasolabial fold. Scalp clear  Chronic and persistent condition with duration or expected duration over one year. Condition is bothersome/symptomatic for patient. Currently flared.   Seborrheic Dermatitis is a chronic persistent rash characterized by pinkness and scaling most commonly of the mid face but also can occur on the scalp (dandruff), ears; mid chest, mid back and groin.  It tends to be exacerbated by stress and cooler weather.  People who have neurologic disease may experience new onset or exacerbation of existing seborrheic dermatitis.  The condition is not curable but treatable and can be controlled.  Treatment Plan: Continue Dove shampoo and conditioner for itchy scalp.   Apply ketoconazole  cream once to twice a day as needed for itching in ears and NL fold. Ddx periorificial dermatitis  INFLAMED SEBORRHEIC KERATOSIS Left Temple x1 Symptomatic, irritating, patient would like treated.  Advised may need more than one treatment due to broad size of lesion.  Destruction of lesion - Left Temple x1 Complexity: simple   Destruction method: cryotherapy   Informed consent: discussed and consent obtained   Timeout:  patient name, date of  birth, surgical site, and procedure verified Lesion destroyed using liquid nitrogen: Yes   Region frozen until ice ball extended beyond lesion: Yes   Cryo cycles: 1 or 2. Outcome: patient  tolerated procedure well with no complications   Post-procedure details: wound care instructions given   Additional details:  Prior to procedure, discussed risks of blister formation, small wound, skin dyspigmentation, or rare scar following cryotherapy. Recommend Vaseline ointment to treated areas while healing.   NEOPLASM OF SKIN Left Alar Crease Skin / nail biopsy Type of biopsy: tangential   Informed consent: discussed and consent obtained   Timeout: patient name, date of birth, surgical site, and procedure verified   Procedure prep:  Patient was prepped and draped in usual sterile fashion Prep type:  Isopropyl alcohol Anesthesia: the lesion was anesthetized in a standard fashion   Anesthetic:  1% lidocaine  w/ epinephrine  1-100,000 buffered w/ 8.4% NaHCO3 Instrument used: DermaBlade   Hemostasis achieved with: pressure and aluminum chloride   Outcome: patient tolerated procedure well   Post-procedure details: sterile dressing applied and wound care instructions given   Dressing type: bandage and petrolatum    Specimen 1 - Surgical pathology Differential Diagnosis: AK vs BCC vs SCC  Check Margins: No  Very small sample in bottle  Plan Mohs if + skin cancer.  AK (ACTINIC KERATOSIS) Left Dorsal Hand x1 Actinic keratoses are precancerous spots that appear secondary to cumulative UV radiation exposure/sun exposure over time. They are chronic with expected duration over 1 year. A portion of actinic keratoses will progress to squamous cell carcinoma of the skin. It is not possible to reliably predict which spots will progress to skin cancer and so treatment is recommended to prevent development of skin cancer.  Recommend daily broad spectrum sunscreen SPF 30+ to sun-exposed areas, reapply every 2 hours as needed.  Recommend staying in the shade or wearing long sleeves, sun glasses (UVA+UVB protection) and wide brim hats (4-inch brim around the entire circumference of the hat). Call for  new or changing lesions. Destruction of lesion - Left Dorsal Hand x1 Complexity: simple   Destruction method: cryotherapy   Informed consent: discussed and consent obtained   Timeout:  patient name, date of birth, surgical site, and procedure verified Lesion destroyed using liquid nitrogen: Yes   Region frozen until ice ball extended beyond lesion: Yes   Cryo cycles: 1 or 2. Outcome: patient tolerated procedure well with no complications   Post-procedure details: wound care instructions given   Additional details:  Prior to procedure, discussed risks of blister formation, small wound, skin dyspigmentation, or rare scar following cryotherapy. Recommend Vaseline ointment to treated areas while healing.   SEBORRHEIC DERMATITIS   Related Medications ketoconazole  (NIZORAL ) 2 % cream Apply once or twice daily to ears as needed for itching LENTIGINES   MULTIPLE BENIGN NEVI   ACTINIC ELASTOSIS   SEBORRHEIC KERATOSES   CHERRY ANGIOMA   Return in about 1 year (around 05/05/2025) for TBSE, HxSCC.  I, Jill Parcell, CMA, am acting as scribe for Boneta Sharps, MD.   Documentation: I have reviewed the above documentation for accuracy and completeness, and I agree with the above.  Boneta Sharps, MD

## 2024-05-10 ENCOUNTER — Ambulatory Visit: Payer: Self-pay | Admitting: Dermatology

## 2024-05-10 LAB — SURGICAL PATHOLOGY

## 2025-05-11 ENCOUNTER — Ambulatory Visit: Admitting: Dermatology
